# Patient Record
Sex: Female | Born: 1955 | Race: White | Hispanic: No | Marital: Married
Health system: Midwestern US, Academic
[De-identification: ages and names within clinical notes are randomized; demographics above are authoritative.]

---

## 2017-02-13 ENCOUNTER — Encounter: Admit: 2017-02-13 | Discharge: 2017-02-13 | Payer: BC Managed Care – PPO

## 2017-02-28 ENCOUNTER — Encounter: Admit: 2017-02-28 | Discharge: 2017-02-28 | Payer: BC Managed Care – PPO

## 2017-02-28 DIAGNOSIS — I10 Essential (primary) hypertension: Principal | ICD-10-CM

## 2017-02-28 DIAGNOSIS — E785 Hyperlipidemia, unspecified: ICD-10-CM

## 2017-02-28 DIAGNOSIS — E0590 Thyrotoxicosis, unspecified without thyrotoxic crisis or storm: ICD-10-CM

## 2017-02-28 DIAGNOSIS — I499 Cardiac arrhythmia, unspecified: ICD-10-CM

## 2017-02-28 DIAGNOSIS — R0600 Dyspnea, unspecified: ICD-10-CM

## 2017-02-28 DIAGNOSIS — F419 Anxiety disorder, unspecified: ICD-10-CM

## 2017-03-14 ENCOUNTER — Ambulatory Visit: Admit: 2017-03-14 | Discharge: 2017-03-15 | Payer: BC Managed Care – PPO

## 2017-03-14 ENCOUNTER — Encounter: Admit: 2017-03-14 | Discharge: 2017-03-14 | Payer: BC Managed Care – PPO

## 2017-03-14 DIAGNOSIS — R0600 Dyspnea, unspecified: ICD-10-CM

## 2017-03-14 DIAGNOSIS — E785 Hyperlipidemia, unspecified: ICD-10-CM

## 2017-03-14 DIAGNOSIS — E0590 Thyrotoxicosis, unspecified without thyrotoxic crisis or storm: ICD-10-CM

## 2017-03-14 DIAGNOSIS — I499 Cardiac arrhythmia, unspecified: ICD-10-CM

## 2017-03-14 DIAGNOSIS — I10 Essential (primary) hypertension: Principal | ICD-10-CM

## 2017-03-14 DIAGNOSIS — R002 Palpitations: Principal | ICD-10-CM

## 2017-03-14 DIAGNOSIS — F419 Anxiety disorder, unspecified: ICD-10-CM

## 2017-03-15 ENCOUNTER — Encounter: Admit: 2017-03-15 | Discharge: 2017-03-15 | Payer: BC Managed Care – PPO

## 2017-03-15 DIAGNOSIS — E785 Hyperlipidemia, unspecified: ICD-10-CM

## 2017-03-15 DIAGNOSIS — R002 Palpitations: Principal | ICD-10-CM

## 2017-03-15 DIAGNOSIS — I10 Essential (primary) hypertension: ICD-10-CM

## 2017-03-15 LAB — LIPID PROFILE
Lab: 120 — ABNORMAL HIGH (ref 35–60)
Lab: 51
Lab: 10
Lab: 138 — ABNORMAL HIGH (ref <100)
Lab: 2
Lab: 290 — ABNORMAL HIGH (ref 150–200)

## 2017-03-15 LAB — THYROID STIMULATING HORMONE-TSH: Lab: 3.4

## 2017-03-22 ENCOUNTER — Encounter: Admit: 2017-03-22 | Discharge: 2017-03-22 | Payer: BC Managed Care – PPO

## 2017-04-05 ENCOUNTER — Encounter: Admit: 2017-04-05 | Discharge: 2017-04-05 | Payer: BC Managed Care – PPO

## 2017-04-08 ENCOUNTER — Ambulatory Visit: Admit: 2017-04-08 | Discharge: 2017-04-08 | Payer: BC Managed Care – PPO

## 2017-04-08 ENCOUNTER — Encounter: Admit: 2017-04-08 | Discharge: 2017-04-08 | Payer: BC Managed Care – PPO

## 2017-04-08 DIAGNOSIS — I10 Essential (primary) hypertension: Secondary | ICD-10-CM

## 2017-04-08 DIAGNOSIS — E785 Hyperlipidemia, unspecified: ICD-10-CM

## 2017-04-08 DIAGNOSIS — R002 Palpitations: Principal | ICD-10-CM

## 2017-04-10 ENCOUNTER — Encounter: Admit: 2017-04-10 | Discharge: 2017-04-10 | Payer: BC Managed Care – PPO

## 2017-04-10 MED ORDER — ATORVASTATIN 40 MG PO TAB
40 mg | ORAL_TABLET | Freq: Every day | ORAL | 3 refills | Status: AC
Start: 2017-04-10 — End: 2018-03-25

## 2017-05-22 ENCOUNTER — Encounter: Admit: 2017-05-22 | Discharge: 2017-05-22 | Payer: BC Managed Care – PPO

## 2017-05-22 DIAGNOSIS — E782 Mixed hyperlipidemia: Principal | ICD-10-CM

## 2017-05-28 LAB — LIPID PROFILE: Lab: 165

## 2017-05-29 ENCOUNTER — Encounter: Admit: 2017-05-29 | Discharge: 2017-05-29 | Payer: BC Managed Care – PPO

## 2017-05-29 DIAGNOSIS — E782 Mixed hyperlipidemia: Principal | ICD-10-CM

## 2018-03-25 ENCOUNTER — Encounter: Admit: 2018-03-25 | Discharge: 2018-03-25 | Payer: BC Managed Care – PPO

## 2018-03-25 MED ORDER — ATORVASTATIN 40 MG PO TAB
40 mg | ORAL_TABLET | Freq: Every day | ORAL | 0 refills | Status: AC
Start: 2018-03-25 — End: 2018-06-17

## 2018-06-17 ENCOUNTER — Encounter: Admit: 2018-06-17 | Discharge: 2018-06-17 | Payer: BC Managed Care – PPO

## 2018-06-17 MED ORDER — ATORVASTATIN 40 MG PO TAB
ORAL_TABLET | Freq: Every day | 0 refills | Status: DC
Start: 2018-06-17 — End: 2018-09-08

## 2018-09-08 ENCOUNTER — Encounter: Admit: 2018-09-08 | Discharge: 2018-09-08

## 2018-09-08 DIAGNOSIS — R002 Palpitations: Secondary | ICD-10-CM

## 2018-09-08 DIAGNOSIS — I10 Essential (primary) hypertension: Secondary | ICD-10-CM

## 2018-09-08 DIAGNOSIS — E785 Hyperlipidemia, unspecified: Secondary | ICD-10-CM

## 2018-09-08 MED ORDER — ATORVASTATIN 40 MG PO TAB
ORAL_TABLET | Freq: Every day | 0 refills | Status: DC
Start: 2018-09-08 — End: 2018-09-30

## 2018-09-08 NOTE — Telephone Encounter
09/08/2018 9:51 AM     I called Rod Holler and got her scheduled with Dr Ricard Dillon in the [place] office also mailed lab orders to her home. Gaynelle Cage, RN

## 2018-09-16 ENCOUNTER — Encounter: Admit: 2018-09-16 | Discharge: 2018-09-16

## 2018-09-16 DIAGNOSIS — E785 Hyperlipidemia, unspecified: Secondary | ICD-10-CM

## 2018-09-16 DIAGNOSIS — R002 Palpitations: Secondary | ICD-10-CM

## 2018-09-16 DIAGNOSIS — I10 Essential (primary) hypertension: Secondary | ICD-10-CM

## 2018-09-16 LAB — COMPREHENSIVE METABOLIC PANEL: Lab: 139 FL (ref 7–11)

## 2018-09-16 LAB — LIPID PROFILE
Lab: 11 % — ABNORMAL HIGH (ref 0.57–1.11)
Lab: 175 % (ref 41–77)
Lab: 54 % (ref 24–44)
Lab: 73 % (ref 4–12)
Lab: 91 % (ref >60)

## 2018-09-30 ENCOUNTER — Encounter: Admit: 2018-09-30 | Discharge: 2018-09-30

## 2018-09-30 DIAGNOSIS — E785 Hyperlipidemia, unspecified: Secondary | ICD-10-CM

## 2018-09-30 MED ORDER — ATORVASTATIN 40 MG PO TAB
ORAL_TABLET | Freq: Every day | 3 refills | Status: AC
Start: 2018-09-30 — End: ?

## 2018-10-02 ENCOUNTER — Encounter: Admit: 2018-10-02 | Discharge: 2018-10-02

## 2018-10-02 ENCOUNTER — Ambulatory Visit: Admit: 2018-10-02 | Discharge: 2018-10-02

## 2018-10-02 DIAGNOSIS — F419 Anxiety disorder, unspecified: Secondary | ICD-10-CM

## 2018-10-02 DIAGNOSIS — I10 Essential (primary) hypertension: Secondary | ICD-10-CM

## 2018-10-02 DIAGNOSIS — R002 Palpitations: Secondary | ICD-10-CM

## 2018-10-02 DIAGNOSIS — E785 Hyperlipidemia, unspecified: Secondary | ICD-10-CM

## 2018-10-02 DIAGNOSIS — I499 Cardiac arrhythmia, unspecified: Secondary | ICD-10-CM

## 2018-10-02 DIAGNOSIS — R0600 Dyspnea, unspecified: Secondary | ICD-10-CM

## 2018-10-02 DIAGNOSIS — E0590 Thyrotoxicosis, unspecified without thyrotoxic crisis or storm: Secondary | ICD-10-CM

## 2018-10-02 NOTE — Assessment & Plan Note
She seems to be well managed on propranolol and I have not recommended any further testing or medication changes today.  I will plan to see her back in about a year but if the palpitations become more bothersome we will get something set up before then.

## 2018-10-02 NOTE — Assessment & Plan Note
Lab Results   Component Value Date    CHOL 175 09/16/2018    TRIG 54 09/16/2018    HDL 73 09/16/2018    LDL 91 09/16/2018    VLDL 11 09/16/2018    CHOLHDLC 2 09/16/2018      We do not know of any atherosclerotic vascular disease in her case so a LDL target of 100 or less seems reasonable.

## 2018-10-02 NOTE — Progress Notes
Date of Service: 10/02/2018    Jasmine Lynch is a 63 y.o. female.       HPI     Tenzin was in the [place] office today for follow-up. 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  She now does yoga 5 days/week and is helping a friend down-size her large house.    She's had a long history of palpitation symptoms for which she's taken propranolol.  I don't think we've ever really caught this on telemetry or on a monitor, but it certainly sounds like SVT.  She's continued to have occasional episodes, but they aren't very bothersome to her anymore and don't last long.  ???  She used to have a lot of trouble with allergies but when she retired this problem resolved, presumably because she was no longer working in the old, mold-infested building down at [employer] [place].    She enjoys her yoga exercise sessions and has had no problems with angina, breathlessness, or lightheadedness.  She denies any TIA or stroke symptoms.???         Vitals:    10/02/18 0919 10/02/18 0924   BP: 132/74 136/76   BP Source: Arm, Left Upper Arm, Right Upper   Pulse: 73    Temp: 36.8 ???C (98.3 ???F)    SpO2: 98%    Weight: 95.3 kg (210 lb)    Height: 1.778 m (5' 10)    PainSc: Zero      Body mass index is 30.13 kg/m???.     Past Medical History  Patient Active Problem List    Diagnosis Date Noted   ??? Hyperlipidemia 02/28/2017   ??? Hyperthyroidism 04/10/2011     1999 - Variability in thyroid levels.  2013 - Normal TSH     ??? Anxiety 04/10/2011   ??? Palpitations 04/10/2011   ??? HTN (hypertension) 03/26/2011     2009 - Medication started for hypertension.       ??? Multiple allergies 03/26/2011   ??? Acid reflux 03/26/2011         Review of Systems   Constitution: Negative.   HENT: Negative.    Eyes: Negative.    Cardiovascular: Negative.    Respiratory: Negative.    Endocrine: Negative.    Hematologic/Lymphatic: Negative.    Skin: Negative.    Musculoskeletal: Negative.    Gastrointestinal: Negative.    Genitourinary: Negative. Neurological: Negative.    Psychiatric/Behavioral: Negative.    Allergic/Immunologic: Negative.        Physical Exam    Physical Exam   General Appearance: no distress   Skin: warm, no ulcers or xanthomas   Digits and Nails: no cyanosis or clubbing   Eyes: conjunctivae and lids normal, pupils are equal and round   Teeth/Gums/Palate: dentition unremarkable, no lesions   Lips & Oral Mucosa: no pallor or cyanosis   Neck Veins: normal JVP , neck veins are not distended   Thyroid: no nodules, masses, tenderness or enlargement   Chest Inspection: chest is normal in appearance   Respiratory Effort: breathing comfortably, no respiratory distress   Auscultation/Percussion: lungs clear to auscultation, no rales or rhonchi, no wheezing   PMI: PMI not enlarged or displaced   Cardiac Rhythm: regular rhythm and normal rate   Cardiac Auscultation: S1, S2 normal, no rub, no gallop   Murmurs: no murmur   Peripheral Circulation: normal peripheral circulation   Carotid Arteries: normal carotid upstroke bilaterally, no bruits   Radial Arteries: normal  symmetric radial pulses   Abdominal Aorta: no abdominal aortic bruit   Pedal Pulses: normal symmetric pedal pulses   Lower Extremity Edema: no lower extremity edema   Abdominal Exam: soft, non-tender, no masses, bowel sounds normal   Liver & Spleen: no organomegaly   Gait & Station: walks without assistance   Muscle Strength: normal muscle tone   Orientation: oriented to time, place and person   Affect & Mood: appropriate and sustained affect   Language and Memory: patient responsive and seems to comprehend information   Neurologic Exam: neurological assessment grossly intact   Other: moves all extremities      Problems Addressed Today  Encounter Diagnoses   Name Primary?   ??? Essential hypertension    ??? Hyperlipidemia, unspecified hyperlipidemia type    ??? Palpitations        Assessment and Plan       HTN (hypertension)  She occasionally checks home blood pressure and it tends to run about 130/80.    Hyperlipidemia  Lab Results   Component Value Date    CHOL 175 09/16/2018    TRIG 54 09/16/2018    HDL 73 09/16/2018    LDL 91 09/16/2018    VLDL 11 09/16/2018    CHOLHDLC 2 09/16/2018      We do not know of any atherosclerotic vascular disease in her case so a LDL target of 100 or less seems reasonable.    Palpitations  She seems to be well managed on propranolol and I have not recommended any further testing or medication changes today.  I will plan to see her back in about a year but if the palpitations become more bothersome we will get something set up before then.      Current Medications (including today's revisions)  ??? ALPRAZolam (XANAX) 0.25 mg tablet Take 0.25 mg by mouth at bedtime as needed.     ??? aspirin 81 mg chewable tablet Take 81 mg by mouth daily.     ??? atorvastatin (LIPITOR) 40 mg tablet TAKE 1 TABLET BY MOUTH EVERY DAY   ??? azelastine-fluticasone (DYMISTA) 137-50 mcg/spray nasal spray Apply 1 spray to each nostril as directed daily.   ??? Calcium Carbonate-Vitamin D2 (OYSTER SHELL CALCIUM-VIT D2) 500 mg(1,250mg ) -200 unit tab Take 2 tablets by mouth daily.   ??? cetirizine (ZYRTEC) 10 mg tablet Take 10 mg by mouth every morning.   ??? erenumab-aooe (AIMOVIG) 140 mg/mL injection Inject 140 mg under the skin every 28 days.   ??? FLAXSEED OIL PO Take 1,200 mg by mouth daily.   ??? glucosamine HCl/chondroitin su (GLUCOSAMINE-CHONDROITIN PO) Take 2 tablets by mouth daily.   ??? Lactobacillus Acidophilus cap Take 1 Cap by mouth daily.     ??? Magnesium Glycinate 100 mg tab Take 200 mg by mouth four times daily.   ??? meloxicam (MOBIC) 7.5 mg tablet Take 7.5 mg by mouth as Needed for Pain.   ??? MULTIVITAMIN W-MINERALS/LUTEIN (CENTRUM SILVER PO) Take 1 Tab by mouth daily.     ??? pantoprazole DR (PROTONIX) 40 mg tablet Take 40 mg by mouth daily.   ??? PROPRANOLOL HCL (INDERAL LA PO) Take 60 mg by mouth twice daily.   ??? triamcinolone (NASACORT) 55 mcg nasal inhaler Apply 2 sprays to each nostril as directed daily.

## 2018-10-02 NOTE — Assessment & Plan Note
She occasionally checks home blood pressure and it tends to run about 130/80.

## 2019-10-19 IMAGING — MG MAMMOGRAM 3D SCREEN, BILATERAL
13 of 16 series · 13 of 16 positions shown · non-contrast
Comparison: none

[R CC (1 of 2)]
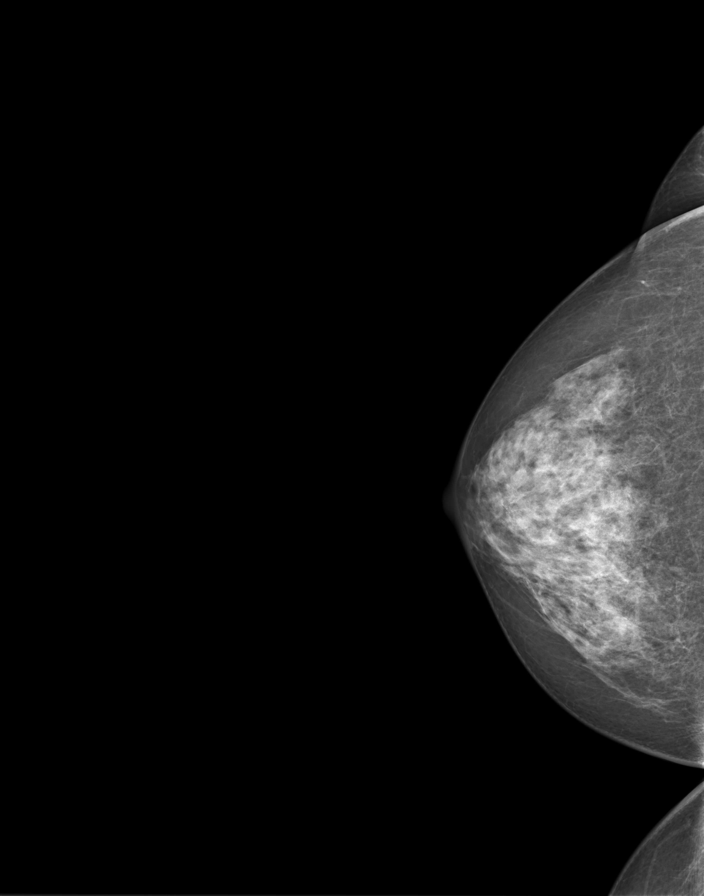

[R tomo (1 of 2)]
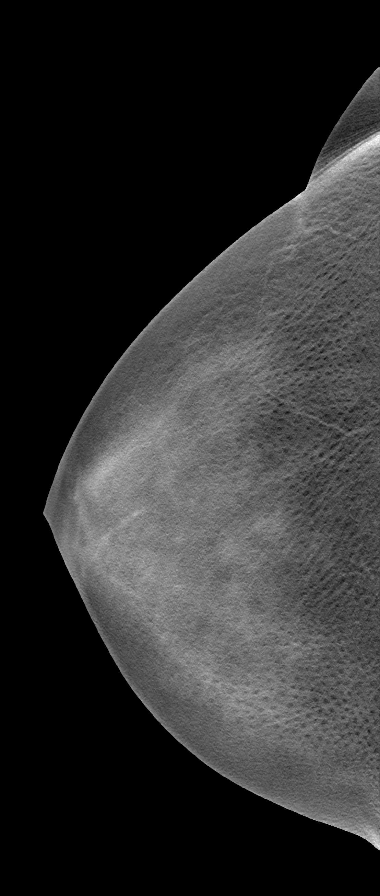

[R CC (2 of 2)]
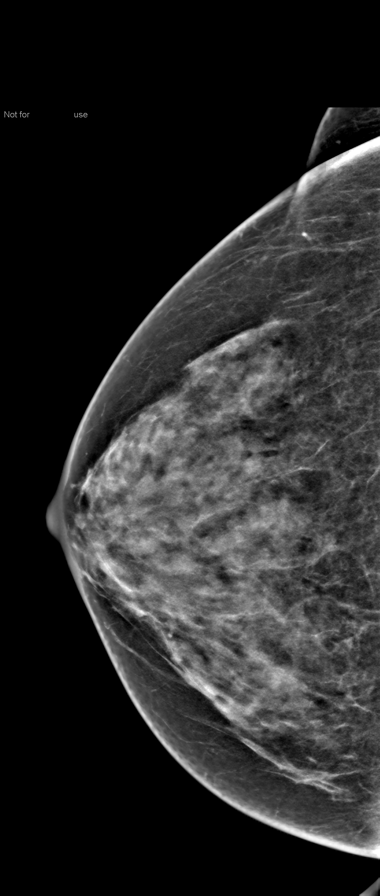

[R (1 of 2)]
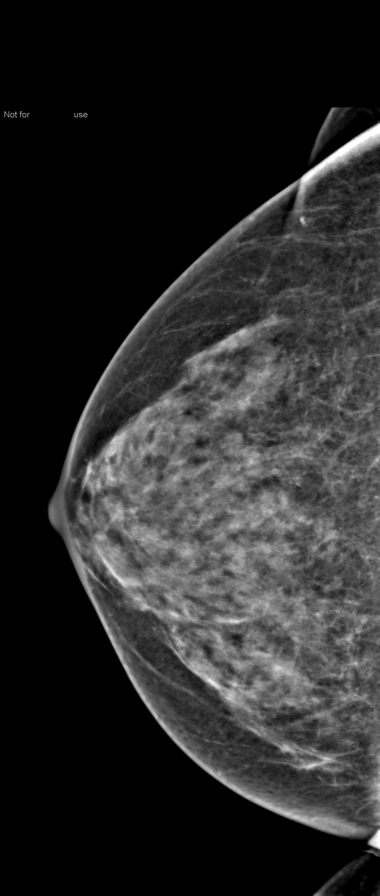

[L CC (1 of 2)]
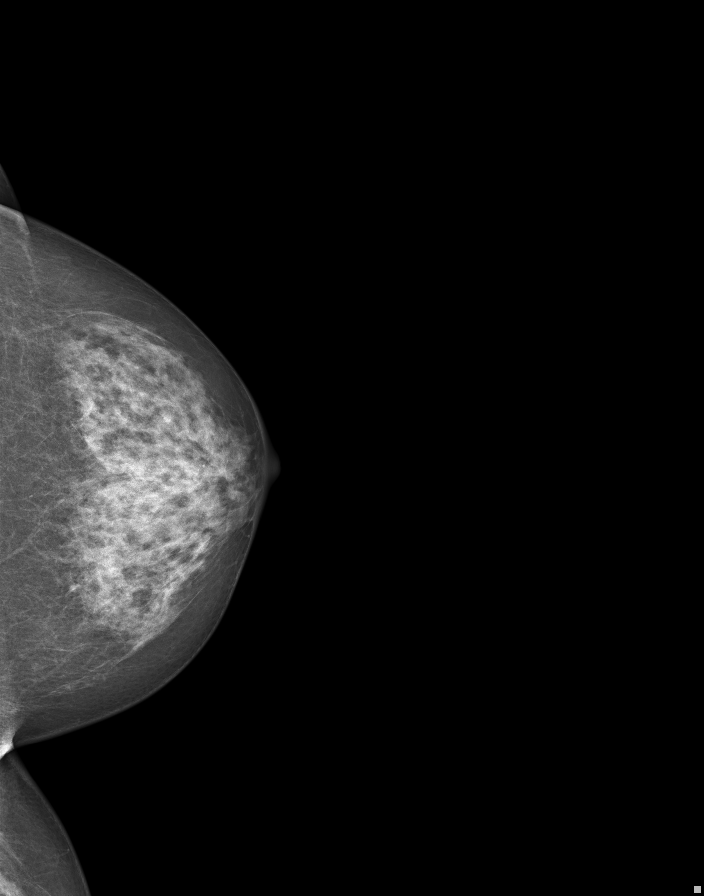

[L tomo]
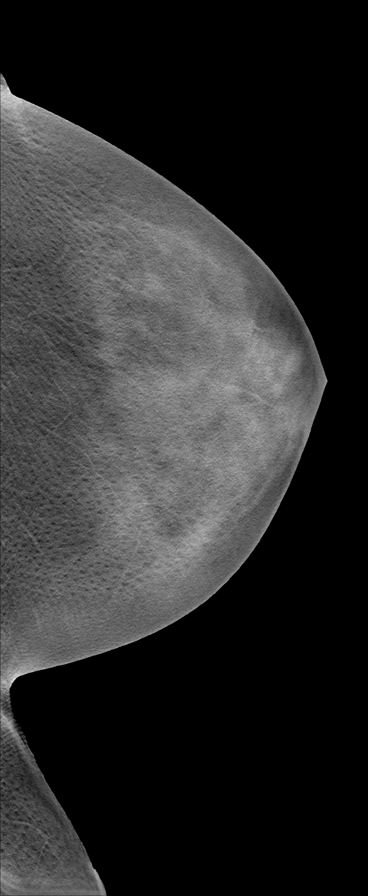

[L CC (2 of 2)]
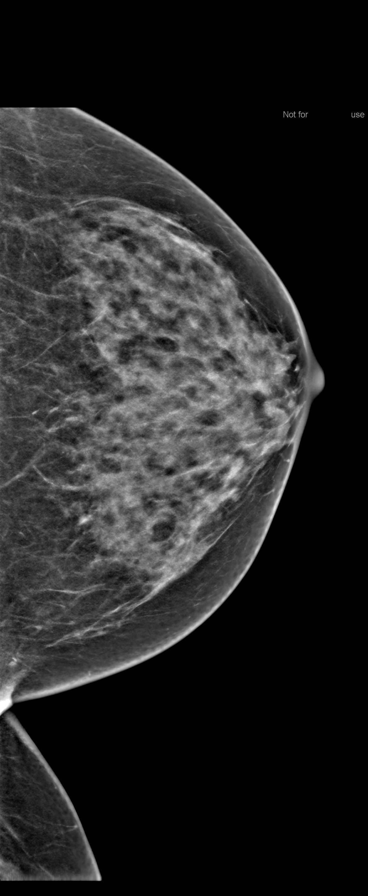

[L]
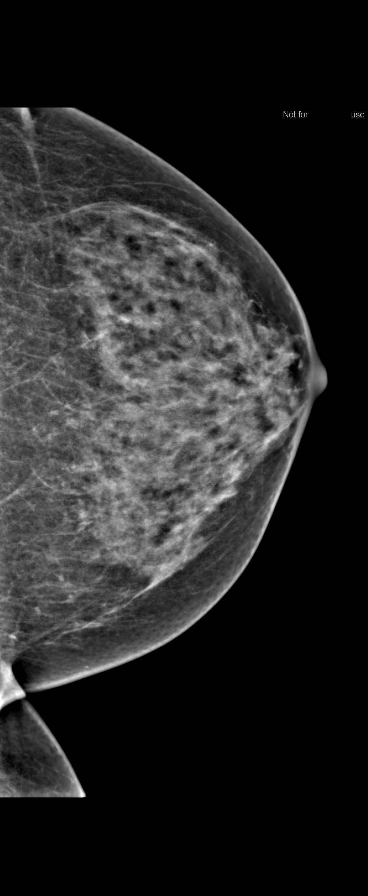

[R MLO (1 of 2)]
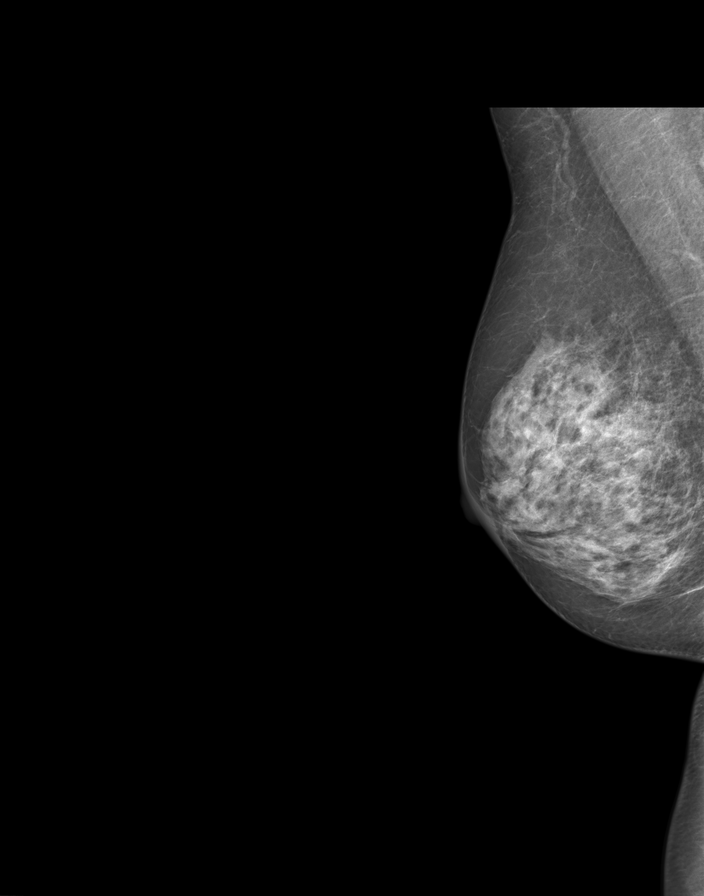

[R tomo (2 of 2)]
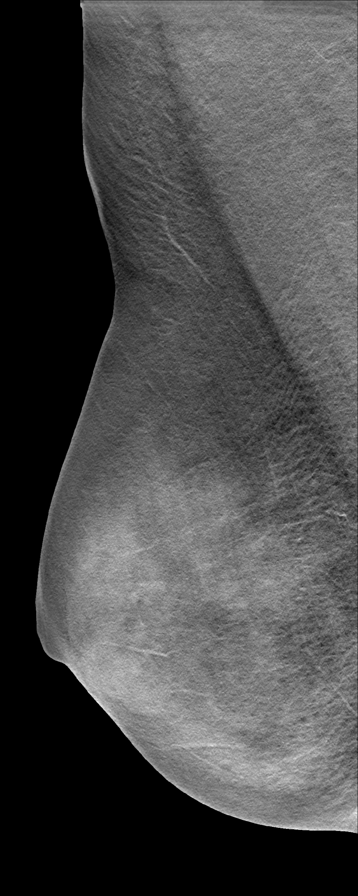

[R MLO (2 of 2)]
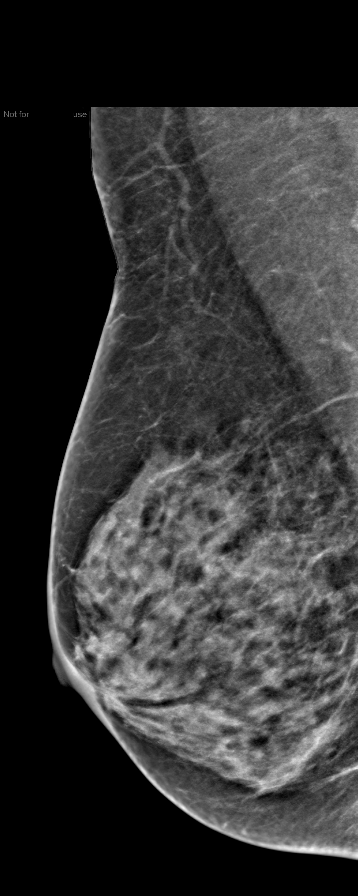

[R (2 of 2)]
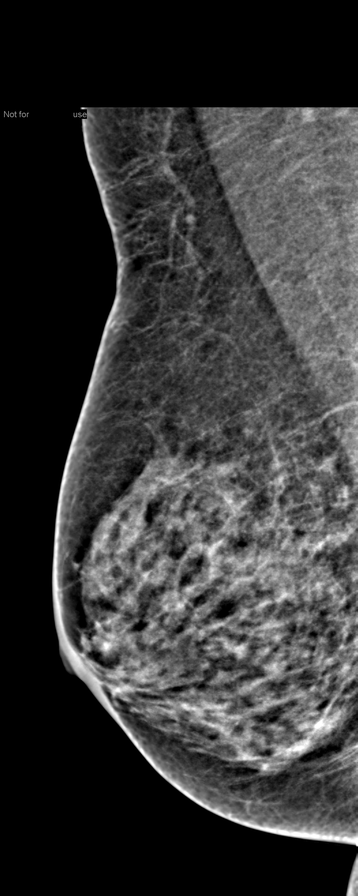

[L MLO]
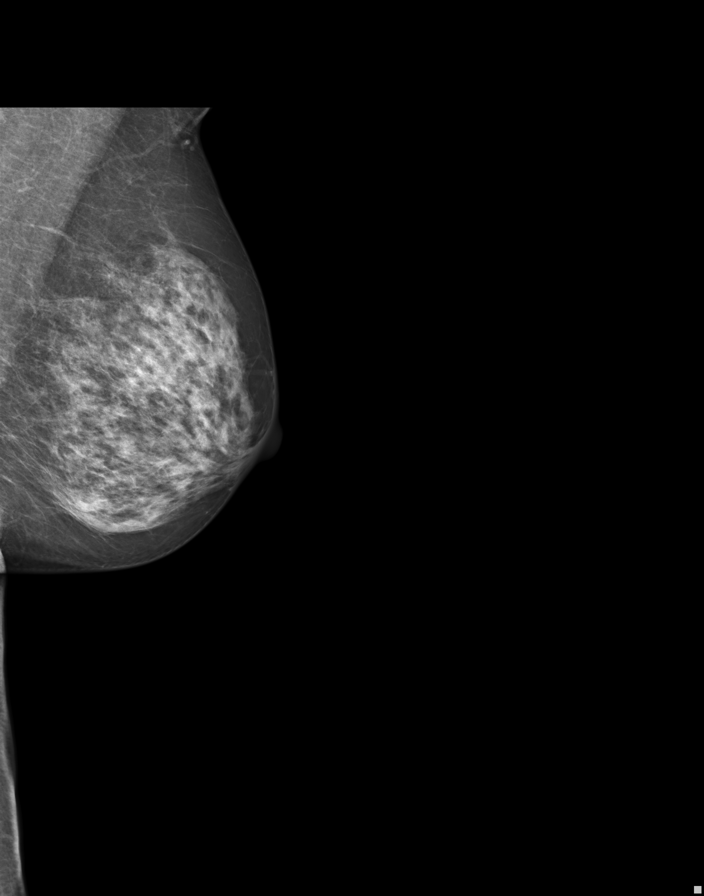

[13 of 16 positions shown; findings below may reference images not displayed]

DIAGNOSTIC STUDIES

EXAM

BILATERAL DIGITAL SCREENING MAMMOGRAM, [id number]; WITH CAD

INDICATION

Screening.

TECHNIQUE

Digital 2D CC and MLO projections obtained with 3D tomographic views per manufacturer's protocol.
ICAD version 7.2 was used during this exam.

COMPARISONS

FINDINGS

The Breast tissue is heterogeneously dense. No new dominant mass or suspicious calcification is
identified.

The false negative rate of mammography is approximately 10%. Management of a palpable abnormality
must be based on clinical exam.

IMPRESSION

BI-RADS 1; Negative.

A twelve month screening mammogram is recommended and a follow-up letter will be scheduled.

Tech Notes:

## 2019-11-05 ENCOUNTER — Encounter: Admit: 2019-11-05 | Discharge: 2019-11-05 | Payer: BC Managed Care – PPO

## 2019-11-05 DIAGNOSIS — E785 Hyperlipidemia, unspecified: Secondary | ICD-10-CM

## 2019-11-05 MED ORDER — ATORVASTATIN 40 MG PO TAB
ORAL_TABLET | Freq: Every day | 3 refills | Status: AC
Start: 2019-11-05 — End: ?

## 2019-12-03 ENCOUNTER — Encounter: Admit: 2019-12-03 | Discharge: 2019-12-03 | Payer: BC Managed Care – PPO

## 2019-12-03 DIAGNOSIS — E785 Hyperlipidemia, unspecified: Secondary | ICD-10-CM

## 2019-12-03 DIAGNOSIS — F419 Anxiety disorder, unspecified: Secondary | ICD-10-CM

## 2019-12-03 DIAGNOSIS — R0600 Dyspnea, unspecified: Secondary | ICD-10-CM

## 2019-12-03 DIAGNOSIS — E0590 Thyrotoxicosis, unspecified without thyrotoxic crisis or storm: Secondary | ICD-10-CM

## 2019-12-03 DIAGNOSIS — I10 Essential (primary) hypertension: Secondary | ICD-10-CM

## 2019-12-03 DIAGNOSIS — I499 Cardiac arrhythmia, unspecified: Secondary | ICD-10-CM

## 2019-12-03 MED ORDER — PROPRANOLOL 60 MG PO CS24
60 mg | ORAL_CAPSULE | Freq: Two times a day (BID) | ORAL | 3 refills | Status: AC
Start: 2019-12-03 — End: ?

## 2019-12-04 ENCOUNTER — Encounter: Admit: 2019-12-04 | Discharge: 2019-12-04 | Payer: BC Managed Care – PPO

## 2019-12-23 ENCOUNTER — Encounter: Admit: 2019-12-23 | Discharge: 2019-12-23 | Payer: BC Managed Care – PPO

## 2019-12-23 MED ORDER — ROSUVASTATIN 20 MG PO TAB
20 mg | ORAL_TABLET | ORAL | 0 refills | 90.00000 days | Status: AC
Start: 2019-12-23 — End: ?

## 2019-12-23 NOTE — Telephone Encounter
Vanice Sarah, MD  Florene Route, RN  So she could start rosuvastatin 20 mg twice weekly at first. Thanks!       Recommendations called to patient. Patient has no questions at this time. Prescription sent to pharmacy.

## 2019-12-23 NOTE — Telephone Encounter
-----   Message from Connye Burkitt, RN sent at 12/23/2019 10:16 AM CST -----  Regarding: FW: Follow up on stopping atorvastatin    ----- Message -----  From: Connye Burkitt, RN  Sent: 12/23/2019  10:04 AM CST  To: Connye Burkitt, RN  Subject: FW: Follow up on stopping atorvastatin             ----- Message -----  From: Janett Labella, RN  Sent: 12/23/2019   9:56 AM CST  To: [facility] Nurse Gen Card Team [place]  Subject: FW: Follow up on stopping atorvastatin             ----- Message -----  From: Devra Dopp  Sent: 12/23/2019   9:49 AM CST  To: [facility] Nurse Triage [place]  Subject: Follow up on stopping atorvastatin               I did stop taking atorvastatin 10/28 as suggested & I am sleeping better. Dr mentioned prescribing another statin instead?

## 2020-08-28 ENCOUNTER — Encounter: Admit: 2020-08-28 | Discharge: 2020-08-28 | Payer: BC Managed Care – PPO

## 2020-08-28 MED ORDER — ROSUVASTATIN 20 MG PO TAB
20 mg | ORAL_TABLET | ORAL | 3 refills
Start: 2020-08-28 — End: ?

## 2020-10-26 ENCOUNTER — Encounter: Admit: 2020-10-26 | Discharge: 2020-10-26 | Payer: BC Managed Care – PPO

## 2020-11-04 ENCOUNTER — Ambulatory Visit: Admit: 2020-11-04 | Discharge: 2020-11-04 | Payer: MEDICARE

## 2020-11-04 ENCOUNTER — Encounter: Admit: 2020-11-04 | Discharge: 2020-11-04 | Payer: MEDICARE

## 2020-11-04 DIAGNOSIS — I480 Paroxysmal atrial fibrillation: Secondary | ICD-10-CM

## 2020-11-04 DIAGNOSIS — F419 Anxiety disorder, unspecified: Secondary | ICD-10-CM

## 2020-11-04 DIAGNOSIS — E785 Hyperlipidemia, unspecified: Secondary | ICD-10-CM

## 2020-11-04 DIAGNOSIS — E0590 Thyrotoxicosis, unspecified without thyrotoxic crisis or storm: Secondary | ICD-10-CM

## 2020-11-04 DIAGNOSIS — I499 Cardiac arrhythmia, unspecified: Secondary | ICD-10-CM

## 2020-11-04 DIAGNOSIS — I10 Essential (primary) hypertension: Secondary | ICD-10-CM

## 2020-11-04 DIAGNOSIS — R0600 Dyspnea, unspecified: Secondary | ICD-10-CM

## 2020-11-04 LAB — TSH WITH FREE T4 REFLEX: TSH: 2.8 uU/mL (ref 0.35–5.00)

## 2020-11-04 MED ORDER — LEVOTHYROXINE 25 MCG PO TAB
25 ug | ORAL_TABLET | Freq: Every day | ORAL | 3 refills | 30.00000 days | Status: AC
Start: 2020-11-04 — End: ?

## 2020-11-04 NOTE — Telephone Encounter
Sent order to pharmacy.    I sent Mychart message to the patient discussing the results and Dr. Barry Dienes' recommendations.    Message  Received: Today  Vanice Sarah, MD  Nicole Cella, MD  Cc: Connye Burkitt, RN  Caleigha Zale, can you please call in a 25 mcg (0.025 mg) Synthroid Rx for her? Thanks.

## 2020-12-04 ENCOUNTER — Encounter: Admit: 2020-12-04 | Discharge: 2020-12-04 | Payer: MEDICARE

## 2020-12-04 MED ORDER — PROPRANOLOL 60 MG PO CS24
ORAL_CAPSULE | Freq: Two times a day (BID) | 3 refills
Start: 2020-12-04 — End: ?

## 2021-05-08 ENCOUNTER — Encounter: Admit: 2021-05-08 | Discharge: 2021-05-08 | Payer: MEDICARE

## 2021-05-10 ENCOUNTER — Encounter: Admit: 2021-05-10 | Discharge: 2021-05-10 | Payer: MEDICARE

## 2021-05-10 ENCOUNTER — Ambulatory Visit: Admit: 2021-05-10 | Discharge: 2021-05-11 | Payer: MEDICARE

## 2021-05-10 DIAGNOSIS — E039 Hypothyroidism, unspecified: Secondary | ICD-10-CM

## 2021-05-10 DIAGNOSIS — R519 Chronic idiopathic facial pain: Secondary | ICD-10-CM

## 2021-05-10 DIAGNOSIS — H269 Unspecified cataract: Secondary | ICD-10-CM

## 2021-05-10 DIAGNOSIS — I10 Essential (primary) hypertension: Secondary | ICD-10-CM

## 2021-05-10 DIAGNOSIS — D72819 Decreased white blood cell count, unspecified: Secondary | ICD-10-CM

## 2021-05-10 DIAGNOSIS — E782 Mixed hyperlipidemia: Secondary | ICD-10-CM

## 2021-05-10 DIAGNOSIS — E559 Vitamin D deficiency, unspecified: Secondary | ICD-10-CM

## 2021-05-10 DIAGNOSIS — K219 Gastro-esophageal reflux disease without esophagitis: Secondary | ICD-10-CM

## 2021-05-10 DIAGNOSIS — E0590 Thyrotoxicosis, unspecified without thyrotoxic crisis or storm: Secondary | ICD-10-CM

## 2021-05-10 DIAGNOSIS — R430 Anosmia: Secondary | ICD-10-CM

## 2021-05-10 DIAGNOSIS — H547 Unspecified visual loss: Secondary | ICD-10-CM

## 2021-05-10 DIAGNOSIS — J301 Allergic rhinitis due to pollen: Secondary | ICD-10-CM

## 2021-05-10 DIAGNOSIS — I499 Cardiac arrhythmia, unspecified: Secondary | ICD-10-CM

## 2021-05-10 DIAGNOSIS — M159 Polyosteoarthritis, unspecified: Secondary | ICD-10-CM

## 2021-05-10 DIAGNOSIS — E785 Hyperlipidemia, unspecified: Secondary | ICD-10-CM

## 2021-05-10 DIAGNOSIS — F419 Anxiety disorder, unspecified: Secondary | ICD-10-CM

## 2021-05-10 DIAGNOSIS — R0600 Dyspnea, unspecified: Secondary | ICD-10-CM

## 2021-05-10 DIAGNOSIS — M549 Dorsalgia, unspecified: Secondary | ICD-10-CM

## 2021-05-10 DIAGNOSIS — T7840XA Allergy, unspecified, initial encounter: Secondary | ICD-10-CM

## 2021-05-10 LAB — VITAMIN B12: VITAMIN B12: 917 pg/mL — ABNORMAL HIGH (ref 180–914)

## 2021-05-10 LAB — LIPID PROFILE: CHOLESTEROL: 218 mg/dL — ABNORMAL HIGH (ref <200)

## 2021-05-10 LAB — CBC AND DIFF
ABSOLUTE BASO COUNT: 0 K/UL (ref 0–0.20)
ABSOLUTE EOS COUNT: 0.1 K/UL (ref 0–0.45)
WBC COUNT: 5.9 K/UL (ref <150)

## 2021-05-10 LAB — COMPREHENSIVE METABOLIC PANEL
ALBUMIN: 4.3 g/dL (ref 3.5–5.0)
ALK PHOSPHATASE: 41 U/L (ref 25–110)
ALT: 20 U/L (ref 7–56)
ANION GAP: 9 K/UL (ref 3–12)
AST: 19 U/L (ref 7–40)
BLD UREA NITROGEN: 19 mg/dL (ref 7–25)
CALCIUM: 9.9 mg/dL (ref 8.5–10.6)
CHLORIDE: 101 MMOL/L (ref 98–110)
CO2: 28 MMOL/L (ref 21–30)
CREATININE: 1 mg/dL — ABNORMAL HIGH (ref 0.4–1.00)
EGFR: 57 mL/min — ABNORMAL LOW (ref >60)
GLUCOSE,PANEL: 94 mg/dL (ref 70–100)
POTASSIUM: 4.3 MMOL/L (ref 3.5–5.1)
SODIUM: 138 MMOL/L — ABNORMAL HIGH (ref <100)
TOTAL BILIRUBIN: 0.4 mg/dL (ref 0.3–1.2)
TOTAL PROTEIN: 7 g/dL (ref 6.0–8.0)

## 2021-05-10 LAB — TSH WITH FREE T4 REFLEX: TSH: 1.9 uU/mL (ref >40)

## 2021-05-10 LAB — SED RATE: ESR: 53 mm/h — ABNORMAL HIGH (ref 0–30)

## 2021-05-11 DIAGNOSIS — G43009 Migraine without aura, not intractable, without status migrainosus: Secondary | ICD-10-CM

## 2021-05-11 DIAGNOSIS — F419 Anxiety disorder, unspecified: Secondary | ICD-10-CM

## 2021-05-15 ENCOUNTER — Encounter: Admit: 2021-05-15 | Discharge: 2021-05-15 | Payer: MEDICARE

## 2021-05-15 DIAGNOSIS — M549 Dorsalgia, unspecified: Secondary | ICD-10-CM

## 2021-05-15 DIAGNOSIS — D72819 Decreased white blood cell count, unspecified: Secondary | ICD-10-CM

## 2021-05-15 DIAGNOSIS — E039 Hypothyroidism, unspecified: Secondary | ICD-10-CM

## 2021-05-15 DIAGNOSIS — G43109 Migraine with aura, not intractable, without status migrainosus: Secondary | ICD-10-CM

## 2021-05-15 DIAGNOSIS — E0590 Thyrotoxicosis, unspecified without thyrotoxic crisis or storm: Secondary | ICD-10-CM

## 2021-05-15 DIAGNOSIS — I499 Cardiac arrhythmia, unspecified: Secondary | ICD-10-CM

## 2021-05-15 DIAGNOSIS — J301 Allergic rhinitis due to pollen: Secondary | ICD-10-CM

## 2021-05-15 DIAGNOSIS — F419 Anxiety disorder, unspecified: Secondary | ICD-10-CM

## 2021-05-15 DIAGNOSIS — R519 Chronic idiopathic facial pain: Secondary | ICD-10-CM

## 2021-05-15 DIAGNOSIS — R430 Anosmia: Secondary | ICD-10-CM

## 2021-05-15 DIAGNOSIS — H269 Unspecified cataract: Secondary | ICD-10-CM

## 2021-05-15 DIAGNOSIS — K219 Gastro-esophageal reflux disease without esophagitis: Secondary | ICD-10-CM

## 2021-05-15 DIAGNOSIS — T7840XA Allergy, unspecified, initial encounter: Secondary | ICD-10-CM

## 2021-05-15 DIAGNOSIS — R0600 Dyspnea, unspecified: Secondary | ICD-10-CM

## 2021-05-15 DIAGNOSIS — H547 Unspecified visual loss: Secondary | ICD-10-CM

## 2021-05-15 DIAGNOSIS — I10 Essential (primary) hypertension: Secondary | ICD-10-CM

## 2021-05-15 DIAGNOSIS — E785 Hyperlipidemia, unspecified: Secondary | ICD-10-CM

## 2021-05-22 ENCOUNTER — Encounter: Admit: 2021-05-22 | Discharge: 2021-05-22 | Payer: MEDICARE

## 2021-05-23 ENCOUNTER — Ambulatory Visit: Admit: 2021-05-23 | Discharge: 2021-05-23 | Payer: MEDICARE

## 2021-05-23 ENCOUNTER — Encounter: Admit: 2021-05-23 | Discharge: 2021-05-23 | Payer: MEDICARE

## 2021-05-23 DIAGNOSIS — R700 Elevated erythrocyte sedimentation rate: Secondary | ICD-10-CM

## 2021-05-23 LAB — SED RATE: ESR: 35 mm/h — ABNORMAL HIGH (ref 0–30)

## 2021-05-23 LAB — RHEUMATOID FACTOR (RF): RF SCREEN: <10 [IU]/mL (ref <25)

## 2021-06-12 ENCOUNTER — Encounter: Admit: 2021-06-12 | Discharge: 2021-06-12 | Payer: MEDICARE

## 2021-07-14 ENCOUNTER — Encounter: Admit: 2021-07-14 | Discharge: 2021-07-14 | Payer: MEDICARE

## 2021-07-14 MED ORDER — ROSUVASTATIN 20 MG PO TAB
20 mg | ORAL_TABLET | ORAL | 3 refills
Start: 2021-07-14 — End: ?

## 2021-08-26 ENCOUNTER — Encounter: Admit: 2021-08-26 | Discharge: 2021-08-26 | Payer: MEDICARE

## 2021-08-26 MED ORDER — LEVOTHYROXINE 25 MCG PO TAB
ORAL_TABLET | 3 refills
Start: 2021-08-26 — End: ?

## 2021-09-20 ENCOUNTER — Encounter: Admit: 2021-09-20 | Discharge: 2021-09-20 | Payer: MEDICARE

## 2021-09-20 ENCOUNTER — Ambulatory Visit: Admit: 2021-09-20 | Discharge: 2021-09-21 | Payer: MEDICARE

## 2021-09-20 DIAGNOSIS — G43109 Migraine with aura, not intractable, without status migrainosus: Secondary | ICD-10-CM

## 2021-09-20 DIAGNOSIS — I10 Essential (primary) hypertension: Secondary | ICD-10-CM

## 2021-09-20 DIAGNOSIS — J301 Allergic rhinitis due to pollen: Secondary | ICD-10-CM

## 2021-09-20 DIAGNOSIS — Z1211 Encounter for screening for malignant neoplasm of colon: Secondary | ICD-10-CM

## 2021-09-20 DIAGNOSIS — M159 Polyosteoarthritis, unspecified: Secondary | ICD-10-CM

## 2021-09-20 DIAGNOSIS — R519 Chronic idiopathic facial pain: Secondary | ICD-10-CM

## 2021-09-20 DIAGNOSIS — E782 Mixed hyperlipidemia: Secondary | ICD-10-CM

## 2021-09-20 DIAGNOSIS — H269 Unspecified cataract: Secondary | ICD-10-CM

## 2021-09-20 DIAGNOSIS — R430 Anosmia: Secondary | ICD-10-CM

## 2021-09-20 DIAGNOSIS — E559 Vitamin D deficiency, unspecified: Secondary | ICD-10-CM

## 2021-09-20 DIAGNOSIS — E785 Hyperlipidemia, unspecified: Secondary | ICD-10-CM

## 2021-09-20 DIAGNOSIS — K219 Gastro-esophageal reflux disease without esophagitis: Secondary | ICD-10-CM

## 2021-09-20 DIAGNOSIS — F419 Anxiety disorder, unspecified: Secondary | ICD-10-CM

## 2021-09-20 DIAGNOSIS — J329 Chronic sinusitis, unspecified: Secondary | ICD-10-CM

## 2021-09-20 DIAGNOSIS — T7840XA Allergy, unspecified, initial encounter: Secondary | ICD-10-CM

## 2021-09-20 DIAGNOSIS — H547 Unspecified visual loss: Secondary | ICD-10-CM

## 2021-09-20 DIAGNOSIS — D72819 Decreased white blood cell count, unspecified: Secondary | ICD-10-CM

## 2021-09-20 DIAGNOSIS — E0590 Thyrotoxicosis, unspecified without thyrotoxic crisis or storm: Secondary | ICD-10-CM

## 2021-09-20 DIAGNOSIS — I480 Paroxysmal atrial fibrillation: Secondary | ICD-10-CM

## 2021-09-20 DIAGNOSIS — R0600 Dyspnea, unspecified: Secondary | ICD-10-CM

## 2021-09-20 DIAGNOSIS — E039 Hypothyroidism, unspecified: Secondary | ICD-10-CM

## 2021-09-20 DIAGNOSIS — I499 Cardiac arrhythmia, unspecified: Secondary | ICD-10-CM

## 2021-09-20 DIAGNOSIS — M549 Dorsalgia, unspecified: Secondary | ICD-10-CM

## 2021-09-20 MED ORDER — ALPRAZOLAM 0.25 MG PO TAB
.25 mg | ORAL_TABLET | Freq: Every evening | ORAL | 0 refills | Status: AC | PRN
Start: 2021-09-20 — End: ?

## 2021-09-20 NOTE — Patient Instructions
Please get labs drawn before your next visit. You should be fasting for 8-12 hours but you can and should drink water or black coffee and take your morning medications except diabetic medications. Wait until you eat to take diabetic medications.

## 2021-09-21 DIAGNOSIS — Z1159 Encounter for screening for other viral diseases: Secondary | ICD-10-CM

## 2021-09-21 DIAGNOSIS — Z889 Allergy status to unspecified drugs, medicaments and biological substances status: Secondary | ICD-10-CM

## 2021-09-28 ENCOUNTER — Encounter: Admit: 2021-09-28 | Discharge: 2021-09-28 | Payer: MEDICARE

## 2021-09-28 ENCOUNTER — Ambulatory Visit: Admit: 2021-09-28 | Discharge: 2021-09-28 | Payer: MEDICARE

## 2021-10-11 ENCOUNTER — Ambulatory Visit: Admit: 2021-10-11 | Discharge: 2021-10-11 | Payer: MEDICARE

## 2021-10-11 ENCOUNTER — Encounter: Admit: 2021-10-11 | Discharge: 2021-10-11 | Payer: MEDICARE

## 2021-10-11 DIAGNOSIS — G43109 Migraine with aura, not intractable, without status migrainosus: Secondary | ICD-10-CM

## 2021-10-11 DIAGNOSIS — I10 Essential (primary) hypertension: Secondary | ICD-10-CM

## 2021-10-11 DIAGNOSIS — Z1159 Encounter for screening for other viral diseases: Secondary | ICD-10-CM

## 2021-10-11 LAB — LIPID PROFILE
CHOLESTEROL: 187 mg/dL (ref <200)
LDL: 89 mg/dL — ABNORMAL HIGH (ref <100)
NON HDL CHOLESTEROL: 108 mg/dL (ref 6.0–8.0)
TRIGLYCERIDES: 56 mg/dL (ref <150)

## 2021-10-11 LAB — VITAMIN B12: VITAMIN B12: 866 pg/mL (ref 180–914)

## 2021-10-11 LAB — MAGNESIUM: MAGNESIUM: 2.2 mg/dL (ref 1.6–2.6)

## 2021-10-11 LAB — CBC
RBC COUNT: 4.3 M/UL (ref 4.0–5.0)
WBC COUNT: 4.2 K/UL — ABNORMAL LOW (ref 4.5–11.0)

## 2021-10-11 LAB — COMPREHENSIVE METABOLIC PANEL
ALBUMIN: 4 g/dL (ref 3.5–5.0)
ALK PHOSPHATASE: 55 U/L (ref 25–110)
ALT: 20 U/L (ref 7–56)
AST: 23 U/L (ref 7–40)
CO2: 26 MMOL/L (ref 21–30)
POTASSIUM: 4.2 MMOL/L (ref 3.5–5.1)
SODIUM: 140 MMOL/L (ref 137–147)

## 2021-10-11 LAB — HEPATITIS C ANTIBODY W REFLEX HCV PCR QUANT

## 2021-10-11 LAB — TSH WITH FREE T4 REFLEX: TSH: 2.1 uU/mL — ABNORMAL HIGH (ref >40)

## 2021-10-13 ENCOUNTER — Encounter: Admit: 2021-10-13 | Discharge: 2021-10-13 | Payer: MEDICARE

## 2021-10-13 DIAGNOSIS — Z1231 Encounter for screening mammogram for malignant neoplasm of breast: Secondary | ICD-10-CM

## 2021-10-17 ENCOUNTER — Encounter: Admit: 2021-10-17 | Discharge: 2021-10-17 | Payer: MEDICARE | Primary: Geriatric Medicine

## 2021-10-25 ENCOUNTER — Encounter: Admit: 2021-10-25 | Discharge: 2021-10-25 | Payer: MEDICARE | Primary: Geriatric Medicine

## 2021-11-10 ENCOUNTER — Encounter: Admit: 2021-11-10 | Discharge: 2021-11-10 | Payer: MEDICARE | Primary: Geriatric Medicine

## 2021-11-10 ENCOUNTER — Ambulatory Visit: Admit: 2021-11-10 | Discharge: 2021-11-10 | Payer: MEDICARE | Primary: Geriatric Medicine

## 2021-11-10 DIAGNOSIS — D72819 Decreased white blood cell count, unspecified: Secondary | ICD-10-CM

## 2021-11-10 DIAGNOSIS — G43109 Migraine with aura, not intractable, without status migrainosus: Secondary | ICD-10-CM

## 2021-11-10 DIAGNOSIS — E0590 Thyrotoxicosis, unspecified without thyrotoxic crisis or storm: Secondary | ICD-10-CM

## 2021-11-10 DIAGNOSIS — E785 Hyperlipidemia, unspecified: Secondary | ICD-10-CM

## 2021-11-10 DIAGNOSIS — J301 Allergic rhinitis due to pollen: Secondary | ICD-10-CM

## 2021-11-10 DIAGNOSIS — R430 Anosmia: Secondary | ICD-10-CM

## 2021-11-10 DIAGNOSIS — I10 Essential (primary) hypertension: Secondary | ICD-10-CM

## 2021-11-10 DIAGNOSIS — M549 Dorsalgia, unspecified: Secondary | ICD-10-CM

## 2021-11-10 DIAGNOSIS — H547 Unspecified visual loss: Secondary | ICD-10-CM

## 2021-11-10 DIAGNOSIS — R0600 Dyspnea, unspecified: Secondary | ICD-10-CM

## 2021-11-10 DIAGNOSIS — E039 Hypothyroidism, unspecified: Secondary | ICD-10-CM

## 2021-11-10 DIAGNOSIS — K219 Gastro-esophageal reflux disease without esophagitis: Secondary | ICD-10-CM

## 2021-11-10 DIAGNOSIS — R519 Chronic idiopathic facial pain: Secondary | ICD-10-CM

## 2021-11-10 DIAGNOSIS — I499 Cardiac arrhythmia, unspecified: Secondary | ICD-10-CM

## 2021-11-10 DIAGNOSIS — T7840XA Allergy, unspecified, initial encounter: Secondary | ICD-10-CM

## 2021-11-10 DIAGNOSIS — H269 Unspecified cataract: Secondary | ICD-10-CM

## 2021-11-10 DIAGNOSIS — J329 Chronic sinusitis, unspecified: Secondary | ICD-10-CM

## 2021-11-10 DIAGNOSIS — F419 Anxiety disorder, unspecified: Secondary | ICD-10-CM

## 2021-11-10 DIAGNOSIS — Z1231 Encounter for screening mammogram for malignant neoplasm of breast: Secondary | ICD-10-CM

## 2021-11-14 ENCOUNTER — Encounter: Admit: 2021-11-14 | Discharge: 2021-11-14 | Payer: MEDICARE | Primary: Geriatric Medicine

## 2021-11-19 ENCOUNTER — Encounter: Admit: 2021-11-19 | Discharge: 2021-11-19 | Payer: MEDICARE | Primary: Geriatric Medicine

## 2021-11-19 MED ORDER — PROPRANOLOL 60 MG PO CS24
ORAL_CAPSULE | 3 refills
Start: 2021-11-19 — End: ?

## 2021-12-06 ENCOUNTER — Encounter: Admit: 2021-12-06 | Discharge: 2021-12-06 | Payer: MEDICARE | Primary: Geriatric Medicine

## 2021-12-06 ENCOUNTER — Ambulatory Visit: Admit: 2021-12-06 | Discharge: 2021-12-07 | Payer: MEDICARE | Primary: Geriatric Medicine

## 2021-12-06 DIAGNOSIS — I499 Cardiac arrhythmia, unspecified: Secondary | ICD-10-CM

## 2021-12-06 DIAGNOSIS — E039 Hypothyroidism, unspecified: Secondary | ICD-10-CM

## 2021-12-06 DIAGNOSIS — K219 Gastro-esophageal reflux disease without esophagitis: Secondary | ICD-10-CM

## 2021-12-06 DIAGNOSIS — H547 Unspecified visual loss: Secondary | ICD-10-CM

## 2021-12-06 DIAGNOSIS — E559 Vitamin D deficiency, unspecified: Secondary | ICD-10-CM

## 2021-12-06 DIAGNOSIS — M549 Dorsalgia, unspecified: Secondary | ICD-10-CM

## 2021-12-06 DIAGNOSIS — E782 Mixed hyperlipidemia: Secondary | ICD-10-CM

## 2021-12-06 DIAGNOSIS — T7840XA Allergy, unspecified, initial encounter: Secondary | ICD-10-CM

## 2021-12-06 DIAGNOSIS — D72819 Decreased white blood cell count, unspecified: Secondary | ICD-10-CM

## 2021-12-06 DIAGNOSIS — I480 Paroxysmal atrial fibrillation: Secondary | ICD-10-CM

## 2021-12-06 DIAGNOSIS — I10 Essential (primary) hypertension: Secondary | ICD-10-CM

## 2021-12-06 DIAGNOSIS — R519 Chronic idiopathic facial pain: Secondary | ICD-10-CM

## 2021-12-06 DIAGNOSIS — F419 Anxiety disorder, unspecified: Secondary | ICD-10-CM

## 2021-12-06 DIAGNOSIS — G43109 Migraine with aura, not intractable, without status migrainosus: Secondary | ICD-10-CM

## 2021-12-06 DIAGNOSIS — E785 Hyperlipidemia, unspecified: Secondary | ICD-10-CM

## 2021-12-06 DIAGNOSIS — R0600 Dyspnea, unspecified: Secondary | ICD-10-CM

## 2021-12-06 DIAGNOSIS — J301 Allergic rhinitis due to pollen: Secondary | ICD-10-CM

## 2021-12-06 DIAGNOSIS — J329 Chronic sinusitis, unspecified: Secondary | ICD-10-CM

## 2021-12-06 DIAGNOSIS — M159 Polyosteoarthritis, unspecified: Secondary | ICD-10-CM

## 2021-12-06 DIAGNOSIS — R430 Anosmia: Secondary | ICD-10-CM

## 2021-12-06 DIAGNOSIS — E0590 Thyrotoxicosis, unspecified without thyrotoxic crisis or storm: Secondary | ICD-10-CM

## 2021-12-06 DIAGNOSIS — H269 Unspecified cataract: Secondary | ICD-10-CM

## 2021-12-07 DIAGNOSIS — G8929 Other chronic pain: Secondary | ICD-10-CM

## 2021-12-07 DIAGNOSIS — Z1211 Encounter for screening for malignant neoplasm of colon: Secondary | ICD-10-CM

## 2021-12-12 ENCOUNTER — Encounter: Admit: 2021-12-12 | Discharge: 2021-12-12 | Payer: MEDICARE | Primary: Geriatric Medicine

## 2021-12-15 ENCOUNTER — Ambulatory Visit: Admit: 2021-12-15 | Discharge: 2021-12-15 | Payer: MEDICARE | Primary: Geriatric Medicine

## 2021-12-15 ENCOUNTER — Encounter: Admit: 2021-12-15 | Discharge: 2021-12-15 | Payer: MEDICARE | Primary: Geriatric Medicine

## 2021-12-15 DIAGNOSIS — M792 Neuralgia and neuritis, unspecified: Secondary | ICD-10-CM

## 2021-12-15 DIAGNOSIS — H269 Unspecified cataract: Secondary | ICD-10-CM

## 2021-12-15 DIAGNOSIS — F419 Anxiety disorder, unspecified: Secondary | ICD-10-CM

## 2021-12-15 DIAGNOSIS — J301 Allergic rhinitis due to pollen: Secondary | ICD-10-CM

## 2021-12-15 DIAGNOSIS — E785 Hyperlipidemia, unspecified: Secondary | ICD-10-CM

## 2021-12-15 DIAGNOSIS — R519 Chronic idiopathic facial pain: Secondary | ICD-10-CM

## 2021-12-15 DIAGNOSIS — E039 Hypothyroidism, unspecified: Secondary | ICD-10-CM

## 2021-12-15 DIAGNOSIS — G43109 Migraine with aura, not intractable, without status migrainosus: Secondary | ICD-10-CM

## 2021-12-15 DIAGNOSIS — I499 Cardiac arrhythmia, unspecified: Secondary | ICD-10-CM

## 2021-12-15 DIAGNOSIS — H547 Unspecified visual loss: Secondary | ICD-10-CM

## 2021-12-15 DIAGNOSIS — U099 Long COVID: Secondary | ICD-10-CM

## 2021-12-15 DIAGNOSIS — T7840XA Allergy, unspecified, initial encounter: Secondary | ICD-10-CM

## 2021-12-15 DIAGNOSIS — E0590 Thyrotoxicosis, unspecified without thyrotoxic crisis or storm: Secondary | ICD-10-CM

## 2021-12-15 DIAGNOSIS — D72819 Decreased white blood cell count, unspecified: Secondary | ICD-10-CM

## 2021-12-15 DIAGNOSIS — M549 Dorsalgia, unspecified: Secondary | ICD-10-CM

## 2021-12-15 DIAGNOSIS — R0600 Dyspnea, unspecified: Secondary | ICD-10-CM

## 2021-12-15 DIAGNOSIS — G500 Trigeminal neuralgia: Secondary | ICD-10-CM

## 2021-12-15 DIAGNOSIS — R430 Anosmia: Secondary | ICD-10-CM

## 2021-12-15 DIAGNOSIS — I10 Essential (primary) hypertension: Secondary | ICD-10-CM

## 2021-12-15 DIAGNOSIS — J329 Chronic sinusitis, unspecified: Secondary | ICD-10-CM

## 2021-12-15 DIAGNOSIS — K219 Gastro-esophageal reflux disease without esophagitis: Secondary | ICD-10-CM

## 2021-12-15 MED ORDER — NORTRIPTYLINE 25 MG PO CAP
75 mg | ORAL_CAPSULE | Freq: Every evening | ORAL | 3 refills | Status: AC
Start: 2021-12-15 — End: ?

## 2021-12-18 ENCOUNTER — Encounter: Admit: 2021-12-18 | Discharge: 2021-12-18 | Payer: MEDICARE | Primary: Geriatric Medicine

## 2021-12-18 ENCOUNTER — Ambulatory Visit: Admit: 2021-12-18 | Discharge: 2021-12-19 | Payer: MEDICARE | Primary: Geriatric Medicine

## 2021-12-18 DIAGNOSIS — H269 Unspecified cataract: Secondary | ICD-10-CM

## 2021-12-18 DIAGNOSIS — K219 Gastro-esophageal reflux disease without esophagitis: Secondary | ICD-10-CM

## 2021-12-18 DIAGNOSIS — M549 Dorsalgia, unspecified: Secondary | ICD-10-CM

## 2021-12-18 DIAGNOSIS — F419 Anxiety disorder, unspecified: Secondary | ICD-10-CM

## 2021-12-18 DIAGNOSIS — E785 Hyperlipidemia, unspecified: Secondary | ICD-10-CM

## 2021-12-18 DIAGNOSIS — J309 Allergic rhinitis, unspecified: Secondary | ICD-10-CM

## 2021-12-18 DIAGNOSIS — R0600 Dyspnea, unspecified: Secondary | ICD-10-CM

## 2021-12-18 DIAGNOSIS — R519 Chronic idiopathic facial pain: Secondary | ICD-10-CM

## 2021-12-18 DIAGNOSIS — E0590 Thyrotoxicosis, unspecified without thyrotoxic crisis or storm: Secondary | ICD-10-CM

## 2021-12-18 DIAGNOSIS — I499 Cardiac arrhythmia, unspecified: Secondary | ICD-10-CM

## 2021-12-18 DIAGNOSIS — B999 Unspecified infectious disease: Secondary | ICD-10-CM

## 2021-12-18 DIAGNOSIS — G43109 Migraine with aura, not intractable, without status migrainosus: Secondary | ICD-10-CM

## 2021-12-18 DIAGNOSIS — I10 Essential (primary) hypertension: Secondary | ICD-10-CM

## 2021-12-18 DIAGNOSIS — J301 Allergic rhinitis due to pollen: Secondary | ICD-10-CM

## 2021-12-18 DIAGNOSIS — H547 Unspecified visual loss: Secondary | ICD-10-CM

## 2021-12-18 DIAGNOSIS — R430 Anosmia: Secondary | ICD-10-CM

## 2021-12-18 DIAGNOSIS — J329 Chronic sinusitis, unspecified: Secondary | ICD-10-CM

## 2021-12-18 DIAGNOSIS — J3489 Other specified disorders of nose and nasal sinuses: Secondary | ICD-10-CM

## 2021-12-18 DIAGNOSIS — T7840XA Allergy, unspecified, initial encounter: Secondary | ICD-10-CM

## 2021-12-18 DIAGNOSIS — D72819 Decreased white blood cell count, unspecified: Secondary | ICD-10-CM

## 2021-12-18 DIAGNOSIS — E039 Hypothyroidism, unspecified: Secondary | ICD-10-CM

## 2021-12-18 NOTE — Progress Notes
Date of Service: 12/18/2021       Subjective:  I had the pleasure of seeing Jasmine Lynch who presents today to the Allergy & Immunology clinic at the [facility] for an Initial Visit.  She was referred by Dr. Windle Guard.  The PCP is Dr.Zwahlen, Darlis Loan.  As you know, she is a 66 y.o. female with reflux, hypertension, chronic sinusitis, allergic rhinitis, anxiety, back pain, hyperlipidemia, migraines.      Chief Complaint   Patient presents with   ? New Patient   ? Runny Nose   ? Itchy Eye       History of Present Illness:     She has had seasonal allergies for all of her life. She has nose and eye symptoms. The most bothersome nose symptoms are nasal congestion, sneezing, thick mucus nasal drainage, posterior drainage that causes itchy throat. She wakes up every morning with eye crusting and itchy eyes. She has these symptoms all year round, worse in summer, spring and fall. She has the same symptoms inside as well as when she is outdoors. She has not found that animals worsen her   Around the age of 25 years, she saw Allergist Dr. Layla Barter and was found to be positive to pollens, dog, cat, mold, dust mites. She had started Allergy shots with him and was on it for at least 20 years. She then stopped it. While she was on it she found it helpful. She then followed up with an Allergist at [facility] [place] after Dr. Layla Barter passed away and on repeat allergy testing twice (last done in 2015) she was negative for all tested allergens. She has not had any repeat allergy testing since then. She cannot recall the name of the Allergist but did not follow up with them since the repeat allergy testing.     She retired in 2015.   She has not had any change in her home environment. She has lived in the same home for the last 27 years. She has no pets at home. She does have carpet in her bedroom. She does not have feathers in pillows.     At this time, her current medication regimen is Claritin 10 mg, Mucinex DM BID. She stopped this when we had the first hard freeze. She is only using Nasacort 2 SPEN once daily. She has been tolerating the medication well without any side effects. But she has not found it to be helpful. She still has a lot of drainage. She feels it has worsened over the last few years without any improvement despite trying different medication regimens.     She follows with ENT specialist, Dr. Teresa Pelton. She had a septoplasty in 2008. He recommended she continue on the Nasacort nasal spray.     No history of eczema, asthma, food allergies. She thinks she is lactose intolerant as every time she consumes dairy she will have nasal congestion the following day. She denies any swelling, abdominal pain, diarrhea, hives with ingestion of dairy.   No history of medication allergies.     She has a history of frequent sinus infections due to the rhinitis symptoms. She gets 1-2/ year. Last was 6-7 months ago. 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 It did get worse after Covid-19. No history of nasal polyps. No history of nasal trauma. No history of nosebleeds.     She has hypothyroidism and takes levothyroxine.   For her migraines on Aimovig.   Was prescribed  nortriptyline for facial pain.   On trazodone for sleep.   No family history of hay fever, asthma.         Medical History:   Diagnosis Date   ? Acid reflux 1990   ? Allergy 1980    Last tested 2015 - told all gone but I?m skeptical   ? 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 Anxiety 04/10/2011   ? Arrhythmia    ? Back pain 2015   ? Cataract 2020    Last checked March 2023   ? 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 Chronic sinusitis 09/20/2021   ? Dyspnea    ? Hay fever    ? HTN (hypertension)    ? Hyperlipidemia 02/28/2017   ? Hyperthyroidism 04/10/2011   ? Hypothyroidism (acquired) 04/10/2011    1999 - Variability in thyroid levels. 2013 - Normal TSH   ? Leukocytopenia, unspecified 05/10/2021   ? Migraine with aura and without status migrainosus, not intractable 11/25/2013   ? Non-seasonal allergic rhinitis due to pollen 05/10/2021    Sees ENT   ? Recurrent infections     Sinus infections   ? Sinus infection    ? Vision decreased 2020     Surgical History:   Procedure Laterality Date   ? HX OOPHORECTOMY Bilateral 1993   ? COLONOSCOPY  2009?   ? DOPPLER ECHOCARDIOGRAPHY     ? ELECTROCARDIOGRAM     ? HX HYSTERECTOMY  1993   ? HX TONSILLECTOMY  1960   ? SINUS SURGERY  2008?     Family History   Problem Relation Age of Onset   ? Alzheimer's Mother    ? Hypertension Father    ? Kidney Disease Father    ? Back pain Father    ? Stroke Brother    ? Stroke Paternal Aunt      Social History     Socioeconomic History   ? Marital status: Married   Tobacco Use   ? Smoking status: Never     Passive exposure: Past   ? Smokeless tobacco: Never   Substance and Sexual Activity   ? Alcohol use: Not Currently     Comment: very rarely   ? Drug use: Never   ? Sexual activity: Not Currently     Partners: Male     Birth control/protection: None                     Asthma Control Test  #1. In the past 4 weeks, how much of the time did your asthma keep you from getting as much done at work, school or at home?: 3 (12/11/2021 11:33 AM)  #2. During the past 4 weeks, how often have you had shortness of breath?: 4-Once or twice a week (12/11/2021 11:33 AM)  #3. During the past 4 weeks, how often did your asthma symptoms (wheezing, coughing, shortness of breath, chest tightness or pain) wake you up at night or earlier than usual in the morning?: 5 (12/11/2021 11:33 AM)  #4. During the past 4 weeks, how often have you used your rescue inhaler or nebulizer medication (such as albuterol)?: 5 (12/11/2021 11:33 AM)  #5. How would you rate your asthma control during the past 4 weeks?: 4 (12/11/2021 11:33 AM)  Total Score: 21 (12/11/2021 11:33 AM)       Rhinitis Control Assessment Test  #1. During the past week, how often did you have nasal congestion?: 2 (12/11/2021 11:33 AM)  #2. During  the past week, how often did you sneeze?: 2 (12/11/2021 11:33 AM)  #3. During the past week, how often did you have watery eyes?: 2 (12/11/2021 11:33 AM)  #4. During the past week, to what extent did your nasal or other allergy symptoms interfere with your sleep?: 3 (12/11/2021 11:33 AM)  #5. During the past week, how well were your nasal or other allergy symptoms controlled?: 3 (12/11/2021 11:33 AM)  #6. During the past week, how often did you avoid any activities (for example, visiting a house with a dog or cat, or gardening) because of your nasal or other allergy symptoms?: 4 (12/11/2021 11:33 AM)  RCAT Total Score: 16 (12/11/2021 11:33 AM)               Review of Systems  A 14 point review of systems has been reviewed and the remainder are all negative other than as noted above and as in the HPI.  Any chronic issues are being addressed by a variety of physicians other than as is noted in the HPI.      Objective:         Medications  ? ALPRAZolam (XANAX) 0.25 mg tablet Take one tablet by mouth at bedtime as needed.   ? aspirin 81 mg chewable tablet Chew one tablet by mouth daily.     ? COQ10 (UBIQUINOL) PO Take 400 mg by mouth daily.   ? erenumab-aooe (AIMOVIG) 140 mg/mL injection Inject 1 mL under the skin every 28 days.   ? FLAXSEED OIL PO Take 1,200 mg by mouth daily.     ? glucosamine HCl/chondroitin su (GLUCOSAMINE-CHONDROITIN PO) Take 2 tablets by mouth daily.   ? Lactobacillus acidophilus (PROBIOTIC PO) Take 1 capsule by mouth daily.   ? levothyroxine (SYNTHROID) 25 mcg tablet TAKE 1 TABLET BY MOUTH DAILY   ? magnesium citrate 125 mg cap Take 1 capsule by mouth twice daily.   ? multivitamin/iron/folic acid (CENTRUM WOMEN PO) Take 1 tablet by mouth daily.   ? nortriptyline (PAMELOR) 25 mg capsule Take three capsules by mouth at bedtime daily. 1 cap qhs x5d, then 2 caps x5d, then 3 caps qhs   ? OYSTER SHELL CALCIUM-VIT D3 500 mg-5 mcg (200 unit) tablet Take one tablet by mouth twice daily.   ? pantoprazole DR (PROTONIX) 40 mg tablet Take one tablet by mouth daily.   ? polypodium leucoto/niacinamide (HELIOCARE ADVANCED PO) Take 2 capsules by mouth daily.   ? propranolol LA (INDERAL LA) 60 mg capsule TAKE 1 CAPSULE BY MOUTH TWICE A DAY   ? rosuvastatin (CRESTOR) 20 mg tablet TAKE ONE TABLET BY MOUTH TWICE WEEKLY.   ? traZODone (DESYREL) 50 mg tablet Take three tablets by mouth at bedtime daily.   ? triamcinolone (NASACORT) 55 mcg nasal inhaler Apply one spray to each nostril as directed twice daily.     No Known Allergies  Vitals:    12/18/21 1310   BP: 126/73   BP Source: Arm, Right Upper   Pulse: 65   Temp: 36.3 ?C (97.4 ?F)   TempSrc: Oral   PainSc: Zero   Weight: 90.7 kg (200 lb)  Comment: pER PT   Height: 175.3 cm (5' 9)           Physical Exam  Vitals reviewed.   Constitutional:       General: She is not in acute distress.     Appearance: Normal appearance. She is not ill-appearing.   HENT:      Head: Normocephalic  and atraumatic.      Right Ear: Tympanic membrane, ear canal and external ear normal.      Left Ear: Tympanic membrane, ear canal and external ear normal.      Nose: Nose normal. No congestion or rhinorrhea.      Comments: Posterior septal perforation     Mouth/Throat:      Mouth: Mucous membranes are moist.      Pharynx: Oropharynx is clear. No oropharyngeal exudate or posterior oropharyngeal erythema.   Eyes:      General:         Right eye: No discharge.         Left eye: No discharge.      Extraocular Movements: Extraocular movements intact.      Conjunctiva/sclera: Conjunctivae normal.      Pupils: Pupils are equal, round, and reactive to light.   Cardiovascular:      Rate and Rhythm: Normal rate and regular rhythm.      Pulses: Normal pulses.      Heart sounds: Normal heart sounds. No murmur heard.  Pulmonary:      Effort: Pulmonary effort is normal. No respiratory distress.      Breath sounds: Normal breath sounds. No wheezing.   Skin:     General: Skin is warm and dry.      Capillary Refill: Capillary refill takes less than 2 seconds. Findings: No erythema or rash.   Neurological:      General: No focal deficit present.      Mental Status: She is alert and oriented to person, place, and time.   Psychiatric:         Mood and Affect: Mood normal.         Behavior: Behavior normal.                      Assessment and Plan:  Problem   Allergic Rhinoconjunctivitis    Hx of rhinoconjunctivitis symptoms for years that are present year round and worse in Spring, Summer and Fall. She had seen an Allergist in her 20's and found to have positive aeroallergen testing, started on Allergy shots and remained on them for 20 years due to benefit. Repeat testing at [facility] in 2015 was negative. Currently, symptoms poorly controlled on current medication regimen. She had a septoplasty in 2008 with Dr. Gordy Levan; no further sinus surgeries. 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     Family Hx negative for similar rhinitis symptoms.   Pet Hx: No pets at home.     Current medication: Nasacort 2 SPEN once daily. Improper technique.     Impression:  Allergic vs non allergic rhinoconjunctivitis vs mixed.   We discussed that although she does have a history of allergic rhinitis, the differential diagnoses for rhinitis is broad and include allergic rhinitis, non-allergic rhinitis/vasomotor rhinitis, gustatory rhinitis, allergic fungal rhinosinusitis, chronic rhinosinusitis, ANCA-associated vasculitis, migraine or other headache disorder, dental decay/dental disease affecting sinuses, trauma related rhinitis, hormonal rhinitis, and/or other etiologies.     Based on her history and other medical conditions, it is likely that her rhinitis is multifactorial.     We discussed with patient that it is reasonable we can obtain repeat aeroallergen SPT. This will be helpful to see what aeroallergens she is sensitized to and provide guidance for aeroallergen avoidance measures. It will also help determine if she is a candidate for Allergy shots, if  she decided to pursue this in the future. Patient would like to proceed with allergy testing. Will need to schedule future visit for testing as she took trazodone yesterday. She has not started the nortriptyline and will hold off until our testing.     Plan:   -  Continue Nasacort 2 puffs each nostril once daily. Point nozzle outwards to help prevent nosebleeds. Demonstrated appropriate technique for use.   - Will plan for aeroallergen SPT on 01/01/22 at 10.20 am.   - Skin testing instructions provided in AVS.      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 No history of recent trauma, surgery. No epistaxis. Discussed with patient that the nasal septum perforation is most likely due to her septoplasty. However, it is best for her to follow up with her ENT specialist as this may not be a new finding. Asymptomatic at this time. Counseled on appropriate use of nasal spray.                         RTC on 12/01/21 for aeroallergen SPT.  Thank you for allowing [place] to participate in this patient's care. Please feel free to contact us with any questions or concerns.    Greer Pickerel, MD  Assistant Clinical Professor   Division of Allergy, Immunology, and Rheumatology  Department of Internal Medicine and Department of Pediatrics  [facility]

## 2021-12-18 NOTE — Patient Instructions
.  aiurskin

## 2021-12-19 ENCOUNTER — Encounter: Admit: 2021-12-19 | Discharge: 2021-12-19 | Payer: MEDICARE | Primary: Geriatric Medicine

## 2021-12-19 DIAGNOSIS — Z889 Allergy status to unspecified drugs, medicaments and biological substances status: Secondary | ICD-10-CM

## 2022-01-01 ENCOUNTER — Encounter: Admit: 2022-01-01 | Discharge: 2022-01-01 | Payer: MEDICARE | Primary: Geriatric Medicine

## 2022-01-01 ENCOUNTER — Ambulatory Visit: Admit: 2022-01-01 | Discharge: 2022-01-02 | Payer: MEDICARE | Primary: Geriatric Medicine

## 2022-01-01 DIAGNOSIS — I499 Cardiac arrhythmia, unspecified: Secondary | ICD-10-CM

## 2022-01-01 DIAGNOSIS — J309 Allergic rhinitis, unspecified: Secondary | ICD-10-CM

## 2022-01-01 DIAGNOSIS — R430 Anosmia: Secondary | ICD-10-CM

## 2022-01-01 DIAGNOSIS — J301 Allergic rhinitis due to pollen: Secondary | ICD-10-CM

## 2022-01-01 DIAGNOSIS — E039 Hypothyroidism, unspecified: Secondary | ICD-10-CM

## 2022-01-01 DIAGNOSIS — T7840XA Allergy, unspecified, initial encounter: Secondary | ICD-10-CM

## 2022-01-01 DIAGNOSIS — B999 Unspecified infectious disease: Secondary | ICD-10-CM

## 2022-01-01 DIAGNOSIS — K219 Gastro-esophageal reflux disease without esophagitis: Secondary | ICD-10-CM

## 2022-01-01 DIAGNOSIS — I10 Essential (primary) hypertension: Secondary | ICD-10-CM

## 2022-01-01 DIAGNOSIS — D72819 Decreased white blood cell count, unspecified: Secondary | ICD-10-CM

## 2022-01-01 DIAGNOSIS — E0590 Thyrotoxicosis, unspecified without thyrotoxic crisis or storm: Secondary | ICD-10-CM

## 2022-01-01 DIAGNOSIS — R519 Chronic idiopathic facial pain: Secondary | ICD-10-CM

## 2022-01-01 DIAGNOSIS — G43109 Migraine with aura, not intractable, without status migrainosus: Secondary | ICD-10-CM

## 2022-01-01 DIAGNOSIS — H269 Unspecified cataract: Secondary | ICD-10-CM

## 2022-01-01 DIAGNOSIS — H547 Unspecified visual loss: Secondary | ICD-10-CM

## 2022-01-01 DIAGNOSIS — F419 Anxiety disorder, unspecified: Secondary | ICD-10-CM

## 2022-01-01 DIAGNOSIS — J329 Chronic sinusitis, unspecified: Secondary | ICD-10-CM

## 2022-01-01 DIAGNOSIS — M549 Dorsalgia, unspecified: Secondary | ICD-10-CM

## 2022-01-01 DIAGNOSIS — R0600 Dyspnea, unspecified: Secondary | ICD-10-CM

## 2022-01-01 DIAGNOSIS — E785 Hyperlipidemia, unspecified: Secondary | ICD-10-CM

## 2022-01-01 MED ORDER — IPRATROPIUM BROMIDE 42 MCG (0.06 %) NA SPRY
2 | Freq: Three times a day (TID) | NASAL | 3 refills | 30.00000 days | Status: AC | PRN
Start: 2022-01-01 — End: ?

## 2022-01-01 NOTE — Progress Notes
Date of Service: 01/01/2022  Date of last contact with Allergy/Immunology: 12/18/2021  Date of last office visit encounter with Dr.D'Mello: 12/18/2021         Subjective:  I had the pleasure of seeing Jasmine Lynch who presents today to the [facility] for aeroallergen skin prick testing.  As you know, she is a 66 y.o. female with reflux, hypertension, chronic sinusitis, allergic rhinitis, anxiety, back pain, hyperlipidemia, migraines.      Chief Complaint   Patient presents with   ? Office Visit Follow Up   ? Runny Nose       History of Present Illness:   Today, she presents to clinic for aeroallergen skin testing. She is feeling well today. No ER/UC/unplanned PCP visits since our last visit. No fevers, cough, chest pain, difficulty breathing, shortness of breath, dizziness, rashes, lesions, pruritus today. Some nasal congestion today but denies any post nasal drainage or runny nose.      She has been off all required medications for the recommended duration to proceed with allergy testing. Reviewed aeroallergen skin testing process and potential adverse reactions, including signs and symptoms of a severe allergic reaction.  Pt verbalized understanding and all questions were answered. E-consent was signed by pt and attached to chart.      There were no new changes to the patient's medical history, surgical history, family medical and social/environmental history.    Rhinitis Control Assessment Test  #1. During the past week, how often did you have nasal congestion?: 2 (12/11/2021 11:33 AM)  #2. During the past week, how often did you sneeze?: 2 (12/11/2021 11:33 AM)  #3. During the past week, how often did you have watery eyes?: 2 (12/11/2021 11:33 AM)  #4. During the past week, to what extent did your nasal or other allergy symptoms interfere with your sleep?: 3 (12/11/2021 11:33 AM)  #5. During the past week, how well were your nasal or other allergy symptoms controlled?: 3 (12/11/2021 11:33 AM)  #6. During the past week, how often did you avoid any activities (for example, visiting a house with a dog or cat, or gardening) because of your nasal or other allergy symptoms?: 4 (12/11/2021 11:33 AM)  RCAT Total Score: 16 (12/11/2021 11:33 AM)      Asthma Control Test  #1. In the past 4 weeks, how much of the time did your asthma keep you from getting as much done at work, school or at home?: 3 (12/11/2021 11:33 AM)  #2. During the past 4 weeks, how often have you had shortness of breath?: 4-Once or twice a week (12/11/2021 11:33 AM)  #3. During the past 4 weeks, how often did your asthma symptoms (wheezing, coughing, shortness of breath, chest tightness or pain) wake you up at night or earlier than usual in the morning?: 5 (12/11/2021 11:33 AM)  #4. During the past 4 weeks, how often have you used your rescue inhaler or nebulizer medication (such as albuterol)?: 5 (12/11/2021 11:33 AM)  #5. How would you rate your asthma control during the past 4 weeks?: 4 (12/11/2021 11:33 AM)  Total Score: 21 (12/11/2021 11:33 AM)                Review of Systems    A 14 point review of systems has been reviewed and the remainder are all negative other than as noted above and as in the HPI.  Any chronic issues are being addressed by a variety of physicians other than as  is noted in the HPI.    Objective:         Medications  ? ALPRAZolam (XANAX) 0.25 mg tablet Take one tablet by mouth at bedtime as needed.   ? aspirin 81 mg chewable tablet Chew one tablet by mouth daily.     ? COQ10 (UBIQUINOL) PO Take 400 mg by mouth daily.   ? erenumab-aooe (AIMOVIG) 140 mg/mL injection Inject 1 mL under the skin every 28 days.   ? FLAXSEED OIL PO Take 1,200 mg by mouth daily.     ? glucosamine HCl/chondroitin su (GLUCOSAMINE-CHONDROITIN PO) Take 2 tablets by mouth daily.   ? ipratropium bromide (ATROVENT) 42 mcg (0.06 %) nasal spray Apply two sprays to each nostril as directed three times daily as needed. Indications: chronic stuffy and runny nose not caused by allergies, runny nose   ? Lactobacillus acidophilus (PROBIOTIC PO) Take 1 capsule by mouth daily.   ? levothyroxine (SYNTHROID) 25 mcg tablet TAKE 1 TABLET BY MOUTH DAILY   ? magnesium citrate 125 mg cap Take 1 capsule by mouth twice daily.   ? multivitamin/iron/folic acid (CENTRUM WOMEN PO) Take 1 tablet by mouth daily.   ? nortriptyline (PAMELOR) 25 mg capsule Take three capsules by mouth at bedtime daily. 1 cap qhs x5d, then 2 caps x5d, then 3 caps qhs   ? OYSTER SHELL CALCIUM-VIT D3 500 mg-5 mcg (200 unit) tablet Take one tablet by mouth twice daily.   ? pantoprazole DR (PROTONIX) 40 mg tablet Take one tablet by mouth daily.   ? polypodium leucoto/niacinamide (HELIOCARE ADVANCED PO) Take 2 capsules by mouth daily.   ? propranolol LA (INDERAL LA) 60 mg capsule TAKE 1 CAPSULE BY MOUTH TWICE A DAY   ? rosuvastatin (CRESTOR) 20 mg tablet TAKE ONE TABLET BY MOUTH TWICE WEEKLY.   ? traZODone (DESYREL) 50 mg tablet Take three tablets by mouth at bedtime daily.   ? triamcinolone (NASACORT) 55 mcg nasal inhaler Apply one spray to each nostril as directed twice daily.     No Known Allergies  Vitals:    01/01/22 0947   BP: (!) 153/85   Pulse: 68   Temp: 36.3 ?C (97.4 ?F)   Resp: 20   SpO2: 99%   PainSc: Eight   Weight: 90.7 kg (200 lb)   Height: 172.7 cm (5' 8)     The patient's BP is elevated and, because we are specialists not managing elevated BP, the patient is referred back to Primary Care Provider for follow up of this problem.        Physical Exam  Vitals reviewed.   Constitutional:       General: She is not in acute distress.     Appearance: Normal appearance. She is not ill-appearing.   HENT:      Right Ear: Tympanic membrane, ear canal and external ear normal.      Left Ear: Tympanic membrane, ear canal and external ear normal.      Nose: Nose normal. No congestion or rhinorrhea.      Mouth/Throat:      Mouth: Mucous membranes are moist.      Pharynx: Oropharynx is clear. No oropharyngeal exudate or posterior oropharyngeal erythema.   Eyes:      General:         Right eye: No discharge.         Left eye: No discharge.      Extraocular Movements: Extraocular movements intact.      Conjunctiva/sclera: Conjunctivae  normal.      Pupils: Pupils are equal, round, and reactive to light.   Cardiovascular:      Rate and Rhythm: Normal rate and regular rhythm.      Pulses: Normal pulses.      Heart sounds: Normal heart sounds. No murmur heard.  Pulmonary:      Effort: Pulmonary effort is normal. No respiratory distress.      Breath sounds: Normal breath sounds. No wheezing.   Skin:     General: Skin is warm and dry.      Capillary Refill: Capillary refill takes less than 2 seconds.      Findings: No erythema or rash.   Neurological:      General: No focal deficit present.      Mental Status: She is alert and oriented to person, place, and time.   Psychiatric:         Mood and Affect: Mood normal.         Behavior: Behavior normal.               Testing Information  Consent Obtained: Yes  Time Antigen Placed: 1037  Needle Type: Multi-Test II Device  Test Location: Back  Antigen Manufacturer: [employer]  Testing Nurse: Darlyne Russian, RN  Reviewing Physician: Greer Pickerel, MD  Time Test Read: 1052    Controls  1. Dil w/50% Glycerin (HS): 0/0  2. Histamine dihydrochloride: 6/6    Trees  Ash, White 1:20 (HS): 0/0  Birch Mix 1:20 (HS): 0/0  Box Elder 1:20 (HS): 0/0  Cedar, Mountain 1:20 (HS): 0/0  Cottonwood 1:20 (HS): 3/3  Elm, Chinese 1:20 (HS): 0/0  Maple Mix 1:20 (HS): 0/0  Mulberry Mix 1:20 (HS): 0/0  Oak Mix 1:20 (HS): 0/0  Pine Mix 1:20 (HS): 3/3  Sweetgum 1:20 (HS): 0/0  Sycamore, American 1:20 (HS): 0/0  Walnut, Black 1:20 (HS): 0/0    Grasses  [place] 10,000 BAU/ml (HS): 0/0  Johnson 1:20 (HS): 0/0  Sweet Vernal 100,000 BAU/ml (HS): 0/0  Timothy 100,000 BAU/ml (HS): 0/0     Weeds  Careless/Pigweed Mix 1:20 (HS): 0/0  Cocklebur 1:20 (HS): 0/0  Dock/Sorrel Mix 1:20 (HS): 0/0  Kochia 1:20 (HS): 0/0  Marsh Elder 1:20 (HS): 0/0  Nettle 1:20 (HS): 0/0  Plantain, English 1:20 (HS): 0/0  Ragweed, Short 1:20 (HS): 0/0  Ragweed, Western 1:20 (HS): 0/0  [place] Thistle 1:20 (HS): 0/0  Sagebrush/Mugwort 1:20 (HS): 0/0    Mites and Cockroaches  Cockroach, American 1:10 (HS): 0/0  Cockroach, German 1:10 (HS): 0/0  Derm. Farinae 30,000 AU/ml (HS): 0/0  Derm. Pteronyssinus 30,000 AU/ml (HS): 0/0    Animals  Cat, AP Pelt 10,000 BAU/ml (HS): 0/0  Dog, AP Hair and Dander 1:10 (HS): 0/0  Feather Mix 1:10 (HS): 0/0    Molds and Fungi  Alternaria Tenuis 1:10 (HS): 0/0  Aspergillus Fumigatus 1:10 (HS): 0/0  Epicoccum Nigrum 1:10 (HS): 0/0  Fusarium Vasinfectum 1:10 (HS): 0/0  Helminthosporium Inters. 1:10 (HS): 0/0  Horm. Cladosporioides 1:10 (HS): 0/0  Mucor Racemosus 1:10 (HS): 0/0  Penicillium [place]urn 1:10 (HS): 0/0  Histamine Phosphate 10mg /ml (HS): 6/6                  Assessment and Plan:  Problem   Allergic Rhinoconjunctivitis    01/01/22: Aeroallergen SPT was positive to two trees with appropriate control response.     Allergic rhinoconjunctivitis education:   Reviewed testing results. Results provides an explanation for patients' symptom during  the spring. 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   Reviewed treatment goals of prevention of symptoms: (1) aeroallergen avoidance measures (2) medication management (3) allergen immunotherapy.   Reviewed aeroallergen avoidance measures.   We discussed aeroallergen immunotherapy but at this time, patient would like to focus on avoidance measures and medication management.      Medication review:   Discussed current medication dosage, use, side effects.   Reviewed appropriate technique for nasal spray use.      Patient questions and concerns were addressed at this visit.       Plan:   - Continue Nasacort 2 puffs each nostril once daily. Point nozzle outwards to help prevent nosebleeds.   - Start Atrovent (ipratropium) 1-2 puffs in each nostril up to three times a day as needed for sneezing, post nasal drip, runny nose. Point nozzle outwards to help prevent nosebleeds.  - Can add nasal saline spray as needed to help reduce drying of nasal passages. Best to use before prescription nasal spray.   - Implement aeroallergen avoidance measures as discussed. Provided in AVS.               RTC in 3 month.  Thank you for allowing [place] to participate in this patient's care. Please feel free to contact us with any questions or concerns.    Greer Pickerel, MD  Assistant Clinical Professor   Division of Allergy, Immunology, and Rheumatology  Department of Internal Medicine and Department of Pediatrics  [facility]             Total Time Today was 15 minutes in the following activities: Preparing to see the patient, Obtaining and/or reviewing separately obtained history, Performing a medically appropriate examination and/or evaluation, Counseling and educating the patient, Ordering medications, Documenting clinical information in the electronic or other health record, Independently interpreting results (not separately reported) and communicating results to the patient and Care coordination (not separately reported)

## 2022-01-09 ENCOUNTER — Encounter: Admit: 2022-01-09 | Discharge: 2022-01-09 | Payer: MEDICARE | Primary: Geriatric Medicine

## 2022-01-09 MED ORDER — NORTRIPTYLINE 75 MG PO CAP
75 mg | ORAL_CAPSULE | Freq: Every evening | ORAL | 1 refills | Status: AC
Start: 2022-01-09 — End: ?

## 2022-01-16 NOTE — Patient Instructions
Thank you for choosing to let us care for you today.  Please read through the following information that will help us continue to provide the best care possible.     -- Preferred method of communication is through MyChart message, if the issue cannot wait until your next scheduled follow up.   -- MyChart may be used for non-emergent communication. Emails are not reviewed after hours or over the weekend/holidays/after 4PM. Staff will reply to your email within 24-48 business hours.       -- If you do not hear from us within one week of a lab or imaging study being completed, please call/send my chart email to the office to be sure that we have received the results. This is especially challenging when tests are done outside of the [facility] system, as many times results do not make it back to our office for a variety of reasons. In our office no news is good news does not apply. You should hear from us with results for each test.  [name] lab/imaging results:  Due to the CARES act, results automatically release to MyChart.  Dr. Kawano-Castillo will continue to send you a result note on any labs that he orders.  With these changes you may see your results before Dr. Kawano-Castillo does.   Please allow up to 72 hours for review and response to your results.     -- If you are having acute (new/sudden onset) or severe/worsening neurologic symptoms, please call 911 or seek care in ED.    -- For scheduling of IMAGING/RADIOLOGY, please call [phone] at your convenience to schedule your studies.  -- For referrals placed during the visit, if you have not heard from scheduling within one week, please call the call center at [phone] to get scheduling assistance.  -- For refills on medications, please first contact your pharmacy, who will fax a refill authorization request form to our office.  Weekdays only. Allow up to 2 business days for refills. Please plan ahead, as refills will not be filled after hours.  -- For scheduling of PT/OT referrals placed during the visit, please call [phone].    -- Our scheduling staff may be reached at [phone] opt. 1 for scheduling needs.     -- Dr. Kawano's nurse may be contacted at [phone] for urgent needs. Staff will return your call within 24 business hours.     For Appointments:   -- Please try to arrive early for your appointment time to help facilitate your visit. 15 minutes early is recommended.   -- If you are late to your appointment, we reserve the right to ask you to reschedule or wait until next available time to be seen in fairness to other patients scheduled that day.   -- There are times when we are running behind in clinic. Our goal is to always be on time, however, there are times when unexpected events occur with patients, which may cause a delay. We appreciate your understanding when this occurs.

## 2022-01-17 ENCOUNTER — Encounter: Admit: 2022-01-17 | Discharge: 2022-01-17 | Payer: MEDICARE | Primary: Geriatric Medicine

## 2022-01-17 NOTE — Telephone Encounter
Patient contacted for pre-visit information gathering r/t referral for migraines (began about 12 years ago after having sinus surgery       Referring provider: Dr Guy Begin        Confirmed appointment for 01/25/22 at 900a.  Also confirmed that patient aware of clinic location.

## 2022-01-25 ENCOUNTER — Encounter: Admit: 2022-01-25 | Discharge: 2022-01-25 | Payer: MEDICARE | Primary: Geriatric Medicine

## 2022-01-25 ENCOUNTER — Ambulatory Visit: Admit: 2022-01-25 | Discharge: 2022-01-26 | Payer: MEDICARE | Primary: Geriatric Medicine

## 2022-01-25 DIAGNOSIS — M549 Dorsalgia, unspecified: Secondary | ICD-10-CM

## 2022-01-25 DIAGNOSIS — I4891 Unspecified atrial fibrillation: Secondary | ICD-10-CM

## 2022-01-25 DIAGNOSIS — R0600 Dyspnea, unspecified: Secondary | ICD-10-CM

## 2022-01-25 DIAGNOSIS — E0590 Thyrotoxicosis, unspecified without thyrotoxic crisis or storm: Secondary | ICD-10-CM

## 2022-01-25 DIAGNOSIS — J329 Chronic sinusitis, unspecified: Secondary | ICD-10-CM

## 2022-01-25 DIAGNOSIS — J301 Allergic rhinitis due to pollen: Secondary | ICD-10-CM

## 2022-01-25 DIAGNOSIS — R519 Chronic idiopathic facial pain: Secondary | ICD-10-CM

## 2022-01-25 DIAGNOSIS — E039 Hypothyroidism, unspecified: Secondary | ICD-10-CM

## 2022-01-25 DIAGNOSIS — I10 Essential (primary) hypertension: Secondary | ICD-10-CM

## 2022-01-25 DIAGNOSIS — F419 Anxiety disorder, unspecified: Secondary | ICD-10-CM

## 2022-01-25 DIAGNOSIS — Z780 Asymptomatic menopausal state: Secondary | ICD-10-CM

## 2022-01-25 DIAGNOSIS — G43109 Migraine with aura, not intractable, without status migrainosus: Secondary | ICD-10-CM

## 2022-01-25 DIAGNOSIS — I480 Paroxysmal atrial fibrillation: Secondary | ICD-10-CM

## 2022-01-25 DIAGNOSIS — T7840XA Allergy, unspecified, initial encounter: Secondary | ICD-10-CM

## 2022-01-25 DIAGNOSIS — R430 Anosmia: Secondary | ICD-10-CM

## 2022-01-25 DIAGNOSIS — B999 Unspecified infectious disease: Secondary | ICD-10-CM

## 2022-01-25 DIAGNOSIS — H547 Unspecified visual loss: Secondary | ICD-10-CM

## 2022-01-25 DIAGNOSIS — K219 Gastro-esophageal reflux disease without esophagitis: Secondary | ICD-10-CM

## 2022-01-25 DIAGNOSIS — E785 Hyperlipidemia, unspecified: Secondary | ICD-10-CM

## 2022-01-25 DIAGNOSIS — I499 Cardiac arrhythmia, unspecified: Secondary | ICD-10-CM

## 2022-01-25 DIAGNOSIS — E782 Mixed hyperlipidemia: Secondary | ICD-10-CM

## 2022-01-25 DIAGNOSIS — H269 Unspecified cataract: Secondary | ICD-10-CM

## 2022-01-25 DIAGNOSIS — D72819 Decreased white blood cell count, unspecified: Secondary | ICD-10-CM

## 2022-01-25 NOTE — Progress Notes
Date of Service: 01/25/2022    Jasmine Lynch is a 66 y.o. female.       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  She reports that she continues to have occasional brief episodes of palpitations that last perhaps a minute or 2.  She does not really have any other associated symptoms and so far this is not particularly bothersome to her.  She has moved on from yoga and is now doing a video workout 6 days/week that she thinks has been very helpful.    She denies any problems with exertional chest discomfort or breathlessness.  She has had no trouble with TIA or stroke symptoms.  She denies any syncope or near syncope.         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 She had seen an Allergist in her 20's and found to have positive aeroallergen testing, started on Allergy shots and remained on them for 20 years due to benefit. Repeat testing at [facility] in 2015 was negative. Currently, symptoms poorly controlled on current medication regimen. She had a septoplasty in 2008 with Dr. Gordy Levan; no further sinus surgeries. 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     Family Hx negative for similar rhinitis symptoms.   Pet Hx: No pets at home.     Current medication: Nasacort 2 SPEN once daily. Improper technique.     Impression:  Allergic vs non allergic rhinoconjunctivitis vs mixed.   We discussed that although she does have a history of allergic rhinitis, the differential diagnoses for rhinitis is broad and include allergic rhinitis, non-allergic rhinitis/vasomotor rhinitis, gustatory rhinitis, allergic fungal rhinosinusitis, chronic rhinosinusitis, ANCA-associated vasculitis, migraine or other headache disorder, dental decay/dental disease affecting sinuses, trauma related rhinitis, hormonal rhinitis, and/or other etiologies.     01/01/22: Aeroallergen SPT was positive to two trees with appropriate control response.     Allergic rhinoconjunctivitis education:   Reviewed testing results. Results provides an explanation for patients' symptom during the spring. 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   Reviewed treatment goals of prevention of symptoms: (1) aeroallergen avoidance measures (2) medication management (3) allergen immunotherapy.   Reviewed aeroallergen avoidance measures.   We discussed aeroallergen immunotherapy but at this time, patient would like to focus on avoidance measures and medication management.      Medication review:   Discussed current medication dosage, use, side effects.   Reviewed appropriate technique for nasal spray use.      Patient questions and concerns were addressed at this visit.       Plan:   - Continue Nasacort 2 puffs each nostril once daily. Point nozzle outwards to help prevent nosebleeds.   - Start Atrovent (ipratropium) 1-2 puffs in each nostril up  to three times a day as needed for sneezing, post nasal drip, runny nose. Point nozzle outwards to help prevent nosebleeds.  - Can add nasal saline spray as needed to help reduce drying of nasal passages. Best to use before prescription nasal spray.   - Implement aeroallergen avoidance measures as discussed. Provided in AVS.       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 No history of recent trauma, surgery. No epistaxis. Discussed with patient that the nasal septum perforation is most likely due to her septoplasty. However, it is best for her to follow up with her ENT specialist as this may not be a new finding. Asymptomatic at this time. Counseled on appropriate use of nasal spray.           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    2023 - mild white coat syndrome, SBP 110-130 at home, most 110-120)      Multiple allergies 03/26/2011    Acid reflux 03/26/2011         Review of Systems   Constitutional: Negative.   HENT:  Positive for tinnitus.    Eyes: Negative.    Cardiovascular:  Positive for dyspnea on exertion, irregular heartbeat and palpitations.   Respiratory: Negative.     Endocrine: Negative.    Hematologic/Lymphatic: Negative.    Skin: Negative.    Musculoskeletal: Negative.    Gastrointestinal: Negative.    Genitourinary: Negative.    Neurological:  Positive for headaches.   Psychiatric/Behavioral: Negative.     Allergic/Immunologic: Negative.        Physical Exam    Physical Exam   General Appearance: no distress   Skin: warm, no ulcers or xanthomas   Digits and Nails: no cyanosis or clubbing   Eyes: conjunctivae and lids normal, pupils are equal and round   Teeth/Gums/Palate: dentition unremarkable, no lesions   Lips & Oral Mucosa: no pallor or cyanosis   Neck Veins: normal JVP , neck veins are not distended   Thyroid: no nodules, masses, tenderness or enlargement   Chest Inspection: chest is normal in appearance   Respiratory Effort: breathing comfortably, no respiratory distress   Auscultation/Percussion: lungs clear to auscultation, no rales or rhonchi, no wheezing   PMI: PMI not enlarged or displaced   Cardiac Rhythm: regular rhythm and normal rate   Cardiac Auscultation: S1, S2 normal, no rub, no gallop   Murmurs: no murmur   Peripheral Circulation: normal peripheral circulation   Carotid Arteries: normal carotid upstroke bilaterally, no bruits   Radial Arteries: normal symmetric radial pulses   Abdominal Aorta: no abdominal aortic bruit   Pedal Pulses: normal symmetric pedal pulses   Lower Extremity Edema: no lower extremity edema   Abdominal Exam: soft, non-tender, no masses, bowel sounds normal   Liver & Spleen: no organomegaly   Gait & Station: walks  without assistance   Muscle Strength: normal muscle tone   Orientation: oriented to time, place and person   Affect & Mood: appropriate and sustained affect   Language and Memory: patient responsive and seems to comprehend information   Neurologic Exam: neurological assessment grossly intact   Other: moves all extremities      Cardiovascular Health Factors  Vitals BP Readings from Last 3 Encounters:   01/25/22 (!) 138/90   01/01/22 (!) 153/85   12/18/21 126/73     Wt Readings from Last 3 Encounters:   01/25/22 94 kg (207 lb 3.2 oz)   01/01/22 90.7 kg (200 lb)   12/18/21 90.7 kg (200 lb)     BMI Readings from Last 3 Encounters:   01/25/22 30.60 kg/m?   01/01/22 30.41 kg/m?   12/18/21 29.53 kg/m?      Smoking Social History     Tobacco Use   Smoking Status Never    Passive exposure: Past   Smokeless Tobacco Never      Lipid Profile Cholesterol   Date Value Ref Range Status   10/11/2021 187 <200 MG/DL Final     HDL   Date Value Ref Range Status   10/11/2021 79 >40 MG/DL Final     LDL   Date Value Ref Range Status   10/11/2021 89 <100 mg/dL Final     Triglycerides   Date Value Ref Range Status   10/11/2021 56 <150 MG/DL Final      Blood Sugar No results found for: HGBA1C  Glucose   Date Value Ref Range Status 10/11/2021 82 70 - 100 MG/DL Final   09/60/4540 94 70 - 100 MG/DL Final   98/12/9145 93  Final          Problems Addressed Today  Encounter Diagnoses   Name Primary?    Paroxysmal atrial fibrillation (HCC) Yes    Mixed hyperlipidemia     Primary hypertension        Assessment and Plan       HTN (hypertension)  Her home blood pressure averages less than 130/80.    Hyperlipidemia  Lab Results   Component Value Date    CHOL 187 10/11/2021    TRIG 56 10/11/2021    HDL 79 10/11/2021    LDL 89 10/11/2021    VLDL 11 10/11/2021    NONHDLCHOL 108 10/11/2021    CHOLHDLC 2 09/16/2018      It is a fair question ask to the about her LDL target.  We really have never documented atherosclerotic disease in her situation and we talked about the possibility of a coronary calcium scan.  She would like to go ahead with the scan and if positive it would mean that we would want to target LDL less than 70.    Paroxysmal atrial fibrillation (HCC)  We talked a little bit about the possibility of a Kardia monitor but I just do not think the brief episodes of palpitation bother her enough to mess with it.  I did not recommend any changes in her medical program today.      Current Medications (including today's revisions)   ALPRAZolam (XANAX) 0.25 mg tablet Take one tablet by mouth at bedtime as needed.    aspirin 81 mg chewable tablet Chew one tablet by mouth daily.      COQ10 (UBIQUINOL) PO Take 400 mg by mouth daily.    erenumab-aooe (AIMOVIG) 140 mg/mL injection Inject 1 mL under the skin every 28 days.  FLAXSEED OIL PO Take 1,200 mg by mouth daily.      glucosamine HCl/chondroitin su (GLUCOSAMINE-CHONDROITIN PO) Take 2 tablets by mouth daily.    ipratropium bromide (ATROVENT) 42 mcg (0.06 %) nasal spray Apply two sprays to each nostril as directed three times daily as needed. Indications: chronic stuffy and runny nose not caused by allergies, runny nose    Lactobacillus acidophilus (PROBIOTIC PO) Take 1 capsule by mouth daily. levothyroxine (SYNTHROID) 25 mcg tablet TAKE 1 TABLET BY MOUTH DAILY    magnesium citrate 125 mg cap Take 1 capsule by mouth twice daily.    multivitamin/iron/folic acid (CENTRUM WOMEN PO) Take 1 tablet by mouth daily.    nortriptyline (PAMELOR) 75 mg capsule Take one capsule by mouth at bedtime daily.    omeprazole DR (PRILOSEC) 20 mg capsule Take one capsule by mouth twice daily.    OYSTER SHELL CALCIUM-VIT D3 500 mg-5 mcg (200 unit) tablet Take one tablet by mouth twice daily.    polypodium leucoto/niacinamide (HELIOCARE ADVANCED PO) Take 2 capsules by mouth daily.    propranolol LA (INDERAL LA) 60 mg capsule TAKE 1 CAPSULE BY MOUTH TWICE A DAY    rosuvastatin (CRESTOR) 20 mg tablet TAKE ONE TABLET BY MOUTH TWICE WEEKLY.    traZODone (DESYREL) 50 mg tablet Take three tablets by mouth at bedtime daily.    triamcinolone (NASACORT) 55 mcg nasal inhaler Apply one spray to each nostril as directed twice daily.     Total time spent on today's office visit was 30 minutes.  This includes face-to-face in person visit with patient as well as nonface-to-face time including review of the EMR, outside records, labs, radiologic studies, echocardiogram & other cardiovascular studies, formation of treatment plan, after visit summary, future disposition, and lastly on documentation.

## 2022-01-25 NOTE — Assessment & Plan Note
Lab Results   Component Value Date    CHOL 187 10/11/2021    TRIG 56 10/11/2021    HDL 79 10/11/2021    LDL 89 10/11/2021    VLDL 11 10/11/2021    NONHDLCHOL 108 10/11/2021    CHOLHDLC 2 09/16/2018      It is a fair question ask to the about her LDL target.  We really have never documented atherosclerotic disease in her situation and we talked about the possibility of a coronary calcium scan.  She would like to go ahead with the scan and if positive it would mean that we would want to target LDL less than 70.

## 2022-01-25 NOTE — Assessment & Plan Note
Her home blood pressure averages less than 130/80.

## 2022-01-25 NOTE — Assessment & Plan Note
We talked a little bit about the possibility of a Kardia monitor but I just do not think the brief episodes of palpitation bother her enough to mess with it.  I did not recommend any changes in her medical program today.

## 2022-01-26 ENCOUNTER — Encounter: Admit: 2022-01-26 | Discharge: 2022-01-26 | Payer: MEDICARE | Primary: Geriatric Medicine

## 2022-01-26 ENCOUNTER — Ambulatory Visit: Admit: 2022-01-26 | Discharge: 2022-01-26 | Payer: MEDICARE | Primary: Geriatric Medicine

## 2022-01-26 DIAGNOSIS — J329 Chronic sinusitis, unspecified: Secondary | ICD-10-CM

## 2022-01-26 DIAGNOSIS — R002 Palpitations: Secondary | ICD-10-CM

## 2022-01-26 DIAGNOSIS — H269 Unspecified cataract: Secondary | ICD-10-CM

## 2022-01-26 DIAGNOSIS — E039 Hypothyroidism, unspecified: Secondary | ICD-10-CM

## 2022-01-26 DIAGNOSIS — R0600 Dyspnea, unspecified: Secondary | ICD-10-CM

## 2022-01-26 DIAGNOSIS — B999 Unspecified infectious disease: Secondary | ICD-10-CM

## 2022-01-26 DIAGNOSIS — K219 Gastro-esophageal reflux disease without esophagitis: Secondary | ICD-10-CM

## 2022-01-26 DIAGNOSIS — M549 Dorsalgia, unspecified: Secondary | ICD-10-CM

## 2022-01-26 DIAGNOSIS — G43109 Migraine with aura, not intractable, without status migrainosus: Secondary | ICD-10-CM

## 2022-01-26 DIAGNOSIS — D72819 Decreased white blood cell count, unspecified: Secondary | ICD-10-CM

## 2022-01-26 DIAGNOSIS — R430 Anosmia: Secondary | ICD-10-CM

## 2022-01-26 DIAGNOSIS — I10 Essential (primary) hypertension: Secondary | ICD-10-CM

## 2022-01-26 DIAGNOSIS — F419 Anxiety disorder, unspecified: Secondary | ICD-10-CM

## 2022-01-26 DIAGNOSIS — T7840XA Allergy, unspecified, initial encounter: Secondary | ICD-10-CM

## 2022-01-26 DIAGNOSIS — H547 Unspecified visual loss: Secondary | ICD-10-CM

## 2022-01-26 DIAGNOSIS — R519 Chronic idiopathic facial pain: Secondary | ICD-10-CM

## 2022-01-26 DIAGNOSIS — Z780 Asymptomatic menopausal state: Secondary | ICD-10-CM

## 2022-01-26 DIAGNOSIS — E785 Hyperlipidemia, unspecified: Secondary | ICD-10-CM

## 2022-01-26 DIAGNOSIS — E0590 Thyrotoxicosis, unspecified without thyrotoxic crisis or storm: Secondary | ICD-10-CM

## 2022-01-26 DIAGNOSIS — I4891 Unspecified atrial fibrillation: Secondary | ICD-10-CM

## 2022-01-26 DIAGNOSIS — J301 Allergic rhinitis due to pollen: Secondary | ICD-10-CM

## 2022-01-26 DIAGNOSIS — I499 Cardiac arrhythmia, unspecified: Secondary | ICD-10-CM

## 2022-01-26 MED ORDER — TOPIRAMATE 25 MG PO TAB
ORAL_TABLET | 6 refills | Status: AC
Start: 2022-01-26 — End: ?

## 2022-01-26 NOTE — Progress Notes
Date of Service: 01/26/2022    Subjective:             Jasmine Lynch is a 66 y.o. female here for evaluation of headaches.     History of Present Illness  Jasmine Lynch is a 66 y.o. female with a past medical history of atrial fibrillation, chronic sinusitis, HTN, hyperlipidemia and hypothyroidism, referred to our clinic due to chronic headaches. Denies head trauma preceding symptoms. Patient describes chronic headaches for the past 10 years, they are localized on bifrontal area, pain described as pressure sensation, headaches are constant, they are not associated with nausea, dizziness, denies phonophobia or photophobia; headaches are not associated with aura or visual/autonomic symptoms, but she reported headaches long time ago were associated with menstrual cycles. Headaches are worse during afternoon/evenings, denies positional changes triggering them and they are not more prominent with cough. Other triggers can include weather changes as well as stress. Patient may wake up in the middle of night with headaches occasionally, and they may interfere with activities of daily living. Frequency has been 7 episodes a week (constant) and severity is 3-10/10. No recent ED visits reported and patient thinks they are overall worse lately (since COVID infection a few months ago). She denies dizzy spells, balance issues or history of falls. Patient also reports chronic non pulsating tinnitus. She was seen by ENT in the past and diagnosed with sinusitis, but prior antibiotics did not help with headaches. There is family history of migraines (sister).     Headache medications:  Preventive:   Aimovig 140mg  qmonth (taking for 2 years, denies clear benefit)  Propranolol 60mg  bid (denies benefit for headaches)  Nortriptyline 75mg  qday (reports some benefit for sleep and headaches are not as intense and denies side effects)   Tried Gabapentin (denies benefit).    Abortive:   Tylenol/Ibuprofen with some benefit (taking a few tablets per week), denies taking nausea meds  She denies taking any other over-the-counter medication recently.   Patient has been scheduled for neck block due to sinus problems.     Review of records:  CT sinuses was unremarkable per patient.        Review of Systems   Constitutional:  Positive for chills and fatigue.   HENT:  Positive for postnasal drip, sinus pressure and tinnitus.    Eyes:  Positive for visual disturbance.   Gastrointestinal:  Positive for constipation.   Endocrine: Positive for cold intolerance.   Musculoskeletal:  Positive for back pain.   Allergic/Immunologic: Positive for environmental allergies.   Neurological:  Positive for headaches.   All other systems reviewed and are negative.    Twelve-point review of systems was done in detail. Patient has noticed recent mood changes but denies history of depression or anxiety. The patient denies history of snoring but occasional drowsiness during the daytime and insomnia in the past. The patient denies numbness or tingling. She denies any history of kidney stones or glaucoma in the past. Denies hx of strokes or CAD.       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    Surgical History:   Procedure Laterality Date    HX OOPHORECTOMY Bilateral 1993    CARDIOVASCULAR STRESS TEST      COLONOSCOPY  2009?    DOPPLER ECHOCARDIOGRAPHY      ELECTROCARDIOGRAM      EVENT MONITOR      HX HYSTERECTOMY  1993    HX TONSILLECTOMY  1960    SINUS SURGERY  2008?       Social History     Socioeconomic History    Marital status: Married   Tobacco Use    Smoking status: Never     Passive exposure: Past    Smokeless tobacco: Never   Substance and Sexual Activity    Alcohol use: Not Currently     Comment: very rarely    Drug use: Never    Sexual activity: Not Currently     Partners: Male     Birth control/protection: None       Family History   Problem Relation Age of Onset    Alzheimer's Mother     Dementia Mother     Hypertension Father     Kidney Disease Father     Back pain Father     Stroke Brother     Stroke Paternal Aunt      ALLERGIES  No Known Allergies     Objective:          ALPRAZolam (XANAX) 0.25 mg tablet Take one tablet by mouth at bedtime as needed.    aspirin 81 mg chewable tablet Chew one tablet by mouth daily.      COQ10 (UBIQUINOL) PO Take 400 mg by mouth daily.    erenumab-aooe (AIMOVIG) 140 mg/mL injection Inject 1 mL under the skin every 28 days.    FLAXSEED OIL PO Take 1,200 mg by mouth daily.      glucosamine HCl/chondroitin su (GLUCOSAMINE-CHONDROITIN PO) Take 2 tablets by mouth daily.    ipratropium bromide (ATROVENT) 42 mcg (0.06 %) nasal spray Apply two sprays to each nostril as directed three times daily as needed. Indications: chronic stuffy and runny nose not caused by allergies, runny nose    Lactobacillus acidophilus (PROBIOTIC PO) Take 1 capsule by mouth daily.    levothyroxine (SYNTHROID) 25 mcg tablet TAKE 1 TABLET BY MOUTH DAILY    magnesium citrate 125 mg cap Take 1 capsule by mouth twice daily. multivitamin/iron/folic acid (CENTRUM WOMEN PO) Take 1 tablet by mouth daily.    nortriptyline (PAMELOR) 75 mg capsule Take one capsule by mouth at bedtime daily.    omeprazole DR (PRILOSEC) 20 mg capsule Take one capsule by mouth twice daily.  OYSTER SHELL CALCIUM-VIT D3 500 mg-5 mcg (200 unit) tablet Take one tablet by mouth twice daily.    polypodium leucoto/niacinamide (HELIOCARE ADVANCED PO) Take 2 capsules by mouth daily.    propranolol LA (INDERAL LA) 60 mg capsule TAKE 1 CAPSULE BY MOUTH TWICE A DAY    rosuvastatin (CRESTOR) 20 mg tablet TAKE ONE TABLET BY MOUTH TWICE WEEKLY.    traZODone (DESYREL) 50 mg tablet Take three tablets by mouth at bedtime daily.    triamcinolone (NASACORT) 55 mcg nasal inhaler Apply one spray to each nostril as directed twice daily.     Vitals:    01/26/22 0841   BP: 133/79   BP Source: Arm, Right Upper   Pulse: 70   Temp: 36.4 ?C (97.6 ?F)   TempSrc: Temporal   PainSc: Three   Weight: 95.3 kg (210 lb)   Height: 175.3 cm (5' 9.02)     Body mass index is 31 kg/m?Marland Kitchen     Physical Exam  GENERAL APPEARANCE: The patient is alert. Patient is in no acute distress, following commands and cooperative. Well developed and well nourished.   HEENT: Normocephalic and atraumatic. Mild tenderness to palpation over sinuses (left maxillary), no tenderness over temporal/occipital area.  EXTREMITIES: no leg swelling.    NEUROLOGIC EXAM:   Orientation: The patient is alert and oriented times three. Patient is following commands. Speech is fluent, intact comprehension, repetition and naming.     Cranial nerves:  1st cranial nerve:  not tested   2nd cranial nerve: normal; Visual fields are full to confrontation. Pupils are equal, round, and reactive to light and accommodation. Non mydriatic funduscopic exam with no papilledema.  3rd, 4th & 6th cranial nerves: Extraocular movements are intact without nystagmus.  5th cranial nerve: normal; intact muscles of mastication. Intact light touch but decreased to temperature on the left.  7th cranial nerve: normal; no facial asymmetry  8th cranial nerve: normal  9th & 10th cranial nerves: normal; Gag is present   11th cranial nerve: normal; Shoulder shrug symmetric   12th cranial nerve: normal; tongue is midline.      Strength: (Right/Left) Deltoid 5/5, Biceps 5/5, Triceps 5/5, Finger ext 5/5, interossei 5/5, Hip Flexion 5/5, Knee ext 5/5, Knee flex 5/5, Ankle dorsiflexion 5/5, Ankle plantarflexion 5/5. Normal bulk and tone in all four limbs without any evidence of an arm drift.  No abnormal movements during exam.  Sensory: Intact to pain, temperature and vibration in both upper/lower extremities.   Coordination is intact finger-to-nose, fine finger movements and rapidly alternating movements.    Deep tendon reflexes are 2+ in biceps, triceps, brachioradialis and patellar bilaterally and 1+ ankle reflex.  Hoffman sign is absent bilaterally. Clonus was not elicited.   Gait is normal based without ataxia. Fair tandem walking.    LABS:  Sodium   Date Value Ref Range Status   10/11/2021 140 137 - 147 MMOL/L Final     Potassium   Date Value Ref Range Status   10/11/2021 4.2 3.5 - 5.1 MMOL/L Final     Chloride   Date Value Ref Range Status   10/11/2021 104 98 - 110 MMOL/L Final     CO2   Date Value Ref Range Status   10/11/2021 26 21 - 30 MMOL/L Final     Anion Gap   Date Value Ref Range Status   10/11/2021 10 3 - 12 Final     Blood Urea Nitrogen   Date Value Ref Range Status   10/11/2021  27 (H) 7 - 25 MG/DL Final     Creatinine   Date Value Ref Range Status   10/11/2021 1.02 (H) 0.4 - 1.00 MG/DL Final     Glucose   Date Value Ref Range Status   10/11/2021 82 70 - 100 MG/DL Final     Calcium   Date Value Ref Range Status   10/11/2021 10.0 8.5 - 10.6 MG/DL Final     AST (SGOT)   Date Value Ref Range Status   10/11/2021 23 7 - 40 U/L Final     ALT (SGPT)   Date Value Ref Range Status   10/11/2021 20 7 - 56 U/L Final     Alk Phosphatase   Date Value Ref Range Status   10/11/2021 55 25 - 110 U/L Final     Albumin   Date Value Ref Range Status   10/11/2021 4.0 3.5 - 5.0 G/DL Final     Total Bilirubin   Date Value Ref Range Status   10/11/2021 0.4 0.3 - 1.2 MG/DL Final     Cholesterol   Date Value Ref Range Status   10/11/2021 187 <200 MG/DL Final     Triglycerides   Date Value Ref Range Status   10/11/2021 56 <150 MG/DL Final     HDL   Date Value Ref Range Status   10/11/2021 79 >40 MG/DL Final     LDL   Date Value Ref Range Status   10/11/2021 89 <100 mg/dL Final     VLDL   Date Value Ref Range Status   10/11/2021 11 MG/DL Final     TSH   Date Value Ref Range Status   10/11/2021 2.19 0.35 - 5.00 MCU/ML Final     Assessment and Plan:  Chronic headaches, probably multifactorial (sinus issues, recent COVID infection and tension type headaches may play a role). Patient with chronic history of frontal headaches with no clear features of migraines. Frequency has been worse lately (constant, since COVID infection a few months ago) and she denies benefit from Aimovig injections, but nortriptyline seems helping with severity and patient reports good tolerance. She denies dizziness, nausea, paresthesias, balance problems or recent falls, but also reports chronic non pulsatile tinnitus. Previous ENT evaluation with diagnosis of sinusitis but patient denies improvement with antibiotic use. Neurological exam shows some sensory loss to temperature (left face) and mild tenderness to palpation over left maxillary sinus, but no other focal signs, non mydriatic funduscopic exam with no papilledema. We will order MRI brain due to worsening headaches and exam findings. We will also start low dose Topiramate to try to optimize headache control. Other diagnosis, including sleep problems and sinus issues may also contribute to headaches, we advised patient to follow up with pain clinic since she may benefit from injections.     Recommendations:  We will start low dose Topiramate for headache prophylaxis (initial goal up to 50mg  bid if needed). The patient was advised about common side effects with this medication use. We may titrate this medication up in the next few weeks/months if needed.   We will also use a limited amount of NSAIDs and Benadryl as abortive medications for headaches.   MRI brain w/o contrast to exclude intracranial abnormalities.   We advised patient to follow up with pain clinic.   Follow-up appointment in 4-6 months.     Total time was 50 minutes. This time was spent preparing to see the patient, obtaining and/or reviewing history, performing an examination, ordering medications,  tests/ procedures, communicating results to the patient, documenting clinical information in the electronic health record and counseling/educating the patient regarding headaches.

## 2022-02-07 ENCOUNTER — Ambulatory Visit: Admit: 2022-02-07 | Discharge: 2022-02-07 | Payer: MEDICARE | Primary: Geriatric Medicine

## 2022-02-07 DIAGNOSIS — Z1211 Encounter for screening for malignant neoplasm of colon: Secondary | ICD-10-CM

## 2022-02-14 ENCOUNTER — Encounter: Admit: 2022-02-14 | Discharge: 2022-02-14 | Payer: MEDICARE | Primary: Geriatric Medicine

## 2022-02-21 ENCOUNTER — Encounter: Admit: 2022-02-21 | Discharge: 2022-02-21 | Payer: MEDICARE | Primary: Geriatric Medicine

## 2022-02-21 ENCOUNTER — Ambulatory Visit: Admit: 2022-02-21 | Discharge: 2022-02-21 | Payer: MEDICARE | Primary: Geriatric Medicine

## 2022-02-21 DIAGNOSIS — I480 Paroxysmal atrial fibrillation: Secondary | ICD-10-CM

## 2022-02-21 DIAGNOSIS — I10 Essential (primary) hypertension: Secondary | ICD-10-CM

## 2022-02-21 DIAGNOSIS — E782 Mixed hyperlipidemia: Secondary | ICD-10-CM

## 2022-02-21 NOTE — Patient Education
Preliminary Report  CardioScore- Coronary Calcium Score      Name: Jasmine Lynch  Date of birth: 13-Mar-1955  Age: 67 y.o.  Date: 02/21/2022   Gender: female Referring Provider: Dr. Barry Dienes      The following information is based on an analysis of the coronary (heart) arteries only. A coronary calcium score was computed based on the size and density of calcium deposits in each artery. Coronary calcium deposits do not correspond directly to the percentage of narrowing of the coronary arteries but are markers for underlying coronary atherosclerosis (narrowing of coronary arteries due to plaque). The higher your score, the higher your risk of future cardiac events. These calcium deposits may begin to form years before any symptoms develop. Early detection and risk factor modification such as smoking, and cholesterol intake, can slow the progress of heart disease.    Coronary Artery Calcium Score   (LM) Left Main 0   (LAD) Left Anterior Descending 1   (LCX) Left Circumflex 0   (RCA) Right Coronary Artery 0   (PDA) Posterior Descending Artery 0   Total 1     Your score indicates minimal identifiable calcification.     0  No identifiable calcification  1-10  Minimal identifiable calcification  11-100  Mild calcification  101-400 Moderate calcification  401+  Significant calcification      DEMOGRAPHIC COMPARISON    Your calcium score is 1, which places you in the 30th percentile for subjects of the same age and gender.     Your percentile rank using the MESA* reference value is 50%. The MESA reference value compares your calcium score to others with the same age (45-84), gender, and race/ethnicity.   *Multi-Ethnic Study of Atherosclerosis - www.mesa-nhlbi.org       ADDITIONAL COMMENTS    This scan is an evaluation of coronary artery calcification and is not intended for any other purpose.     This is a preliminary report. If additional information is noted on your final report, you will be contacted. RECOMMENDATIONS    _X_ Risk Factor Reduction  Diet: Eat a balanced meal that consists of a lean protein, vegetables, healthy fat and nutrient dense carbohydrates. Limit saturated and trans fat, red meat, highly processed foods, sweets, and sugar-sweetened beverages. Choose options that include:  A variety of fruits and vegetables-choose 2 colors at each meal  Lean proteins  Low-fat dairy products  Nuts and legumes  Heart healthy fats (Extra virgin olive oil and avocado)    Exercise: Get 150 minutes of physical activity per week. Physical activity should increase your heart rate, strengthen major muscle groups and increase flexibility. Simple ways to add more activity to your day include walking the dog, taking a family walk, parking farther away, and taking the stairs.    Keep cholesterol, blood pressure, blood sugar and weight within normal limits.    Manage stress and sleep hygiene.    Avoid tobacco products and excess alcohol consumption.    __ Risk factor reduction + follow up with your healthcare provider or Cardiologist. Further testing may be considered.     __ Risk factor reduction + follow up with a Cardiologist. Further testing is recommended.    _X_ Repeat CardioScore *Consult with your healthcare provider before repeating  _X_ 3-5 years  __ 5 years    __ Handouts provided    Should you ever experience chest pain, difficulty in breathing, discomfort radiating into your neck or arm, or discomfort combined with lightheadedness, sweating,  fainting or nausea, you should seek prompt medical attention.       Reviewed by: Daivd Council    CardioScore Educator/Exercise Specialist  Certified Health and Well-being Coach  [facility]  Department of Cardiovascular Medicine  [address]  [place], [place] [address]  Phone: [phone]  Email: [email or web address]

## 2022-02-22 ENCOUNTER — Encounter: Admit: 2022-02-22 | Discharge: 2022-02-22 | Payer: MEDICARE | Primary: Geriatric Medicine

## 2022-02-23 ENCOUNTER — Encounter: Admit: 2022-02-23 | Discharge: 2022-02-23 | Payer: MEDICARE | Primary: Geriatric Medicine

## 2022-02-23 NOTE — Telephone Encounter
-----  Message from Michiel Cowboy, MD sent at 02/22/2022  1:58 PM CST -----  [facility] can you please let Jasmine Lynch know that her calcium score really looks great.  No change in medications.  Thanks.    Cc:  Dr. Ruthy Dick

## 2022-02-23 NOTE — Telephone Encounter
Results and recommendations called to patient.

## 2022-02-26 ENCOUNTER — Encounter: Admit: 2022-02-26 | Discharge: 2022-02-26 | Payer: MEDICARE | Primary: Geriatric Medicine

## 2022-02-26 NOTE — Telephone Encounter
-----  Message from Steven D Owens, MD sent at 02/22/2022  1:58 PM CST -----  [facility] can you please let Jasmine Lynch know that her calcium score really looks great.  No change in medications.  Thanks.    Cc:  Dr. Zwahlen

## 2022-02-26 NOTE — Telephone Encounter
Called and discussed results with patient.  No questions at this time.  Pt will callback with any questions, concerns or problems.

## 2022-03-29 ENCOUNTER — Encounter: Admit: 2022-03-29 | Discharge: 2022-03-29 | Payer: MEDICARE | Primary: Geriatric Medicine

## 2022-03-29 ENCOUNTER — Ambulatory Visit: Admit: 2022-03-29 | Discharge: 2022-03-29 | Payer: MEDICARE | Primary: Geriatric Medicine

## 2022-03-29 DIAGNOSIS — R519 Sinus headache: Secondary | ICD-10-CM

## 2022-04-02 ENCOUNTER — Encounter: Admit: 2022-04-02 | Discharge: 2022-04-02 | Payer: MEDICARE | Primary: Geriatric Medicine

## 2022-04-02 ENCOUNTER — Ambulatory Visit: Admit: 2022-04-02 | Discharge: 2022-04-03 | Payer: MEDICARE | Primary: Geriatric Medicine

## 2022-04-02 DIAGNOSIS — F419 Anxiety disorder, unspecified: Secondary | ICD-10-CM

## 2022-04-02 DIAGNOSIS — J301 Allergic rhinitis due to pollen: Secondary | ICD-10-CM

## 2022-04-02 DIAGNOSIS — H269 Unspecified cataract: Secondary | ICD-10-CM

## 2022-04-02 DIAGNOSIS — G43109 Migraine with aura, not intractable, without status migrainosus: Secondary | ICD-10-CM

## 2022-04-02 DIAGNOSIS — H547 Unspecified visual loss: Secondary | ICD-10-CM

## 2022-04-02 DIAGNOSIS — Z780 Asymptomatic menopausal state: Secondary | ICD-10-CM

## 2022-04-02 DIAGNOSIS — E0590 Thyrotoxicosis, unspecified without thyrotoxic crisis or storm: Secondary | ICD-10-CM

## 2022-04-02 DIAGNOSIS — R519 Chronic idiopathic facial pain: Secondary | ICD-10-CM

## 2022-04-02 DIAGNOSIS — M549 Dorsalgia, unspecified: Secondary | ICD-10-CM

## 2022-04-02 DIAGNOSIS — T7840XA Allergy, unspecified, initial encounter: Secondary | ICD-10-CM

## 2022-04-02 DIAGNOSIS — I10 Essential (primary) hypertension: Secondary | ICD-10-CM

## 2022-04-02 DIAGNOSIS — I499 Cardiac arrhythmia, unspecified: Secondary | ICD-10-CM

## 2022-04-02 DIAGNOSIS — R0600 Dyspnea, unspecified: Secondary | ICD-10-CM

## 2022-04-02 DIAGNOSIS — R002 Palpitations: Secondary | ICD-10-CM

## 2022-04-02 DIAGNOSIS — I4891 Unspecified atrial fibrillation: Secondary | ICD-10-CM

## 2022-04-02 DIAGNOSIS — R430 Anosmia: Secondary | ICD-10-CM

## 2022-04-02 DIAGNOSIS — K219 Gastro-esophageal reflux disease without esophagitis: Secondary | ICD-10-CM

## 2022-04-02 DIAGNOSIS — B999 Unspecified infectious disease: Secondary | ICD-10-CM

## 2022-04-02 DIAGNOSIS — E785 Hyperlipidemia, unspecified: Secondary | ICD-10-CM

## 2022-04-02 DIAGNOSIS — J329 Chronic sinusitis, unspecified: Secondary | ICD-10-CM

## 2022-04-02 DIAGNOSIS — D72819 Decreased white blood cell count, unspecified: Secondary | ICD-10-CM

## 2022-04-02 DIAGNOSIS — G500 Trigeminal neuralgia: Secondary | ICD-10-CM

## 2022-04-02 DIAGNOSIS — E039 Hypothyroidism, unspecified: Secondary | ICD-10-CM

## 2022-04-02 MED ORDER — BUPIVACAINE (PF) 0.5 % (5 MG/ML) IJ SOLN
1 mL | Freq: Once | INTRAMUSCULAR | 0 refills | Status: CP | PRN
Start: 2022-04-02 — End: ?

## 2022-04-02 MED ORDER — LIDOCAINE (PF) 20 MG/ML (2 %) IJ SOLN
1 mL | Freq: Once | INTRAMUSCULAR | 0 refills | Status: CP | PRN
Start: 2022-04-02 — End: ?

## 2022-04-02 NOTE — Procedures
Attending Surgeon: Evelina Bucy, MD    Anesthesia: Local    Pre-Procedure Diagnosis:   1. Trigeminal neuralgia    2. Facial pain    3. Neuropathic pain        Post-Procedure Diagnosis:   1. Trigeminal neuralgia    2. Facial pain    3. Neuropathic pain        [facility]  Nerve: Sphenopalantine ganglion  Laterality: bilateral   on 04/02/2022 11:15 AM    Consent:   Consent obtained: verbal  Consent given by: patient  Alternatives discussed: referral, no treatment, delayed treatment and alternative treatment  Discussed with patient the purpose of the treatment/procedure, other ways of treating my condition, including no treatment/ procedure and the risks and benefits of the alternatives. Patient has decided to proceed with treatment/procedure.        Universal Protocol:  Relevant documents: relevant documents present and verified  Site marked: the operative site was marked  Patient identity confirmed: Patient identify confirmed verbally with patient.        Time out: Immediately prior to procedure a time out was called to verify the correct patient, procedure, equipment, support staff and site/side marked as required        Procedures Details:   Indications: Neuritis and Pain Relief  Preparation: Patient was prepped and draped in the usual sterile fashion.  Prep: 2% chlorhexidine  Patient position: supine  Guidance: anatomical  Right Medication Administered - 1 mL lidocaine (PF) 20 mg/mL (2 %); 1 mL bupivacaine PF 0.5 %  Medication Administered - 1 mL lidocaine (PF) 20 mg/mL (2 %); 1 mL bupivacaine PF 0.5 %  Patient tolerance: tolerated well, no immediate complications  Comments:   PROCEDURE: bilateral Sphenopalatine Ganglion Block    SURGEON: Evelina Bucy, MD    MEDICATION: 1 mL of 0.5% bupivacaine each side    LOCAL ANESTHETIC: 2% lidocaine    SEDATION: None.    ESTIMATED BLOOD LOSS: None.    SPECIMENS REMOVED: None    COMPLICATIONS: None.    TECHNIQUE: A time-out was taken to identify the correct patient, procedure, and site prior to starting the procedure. Lying in a supine position with the back slightly elevated and the head in a sniffing position, an 18ga-IV catheter with the needle removed was used to spray 2% lidocaine into the nasal passage bilaterally with a total of 1.5 mL per side. A sterile cotton-tipped applicator with an 18ga IV catheter inserted in the distal hollow tip, was soaked in 0.5% bupivacaine and then gently inserted into the nares and advanced along the superior border of the middle turbinate until it came to rest against the posterior nasopharynx. A second applicator was placed in similar fashion on the other side.    The applicators were left in place for 20 minutes. A syringe with 0.5% bupivacaine was then attached to the IV catheter inserted into the tip of each applicator and 0.82mL of 0.5% bupivacaine was injected.    The applicators were then removed after another 10 minutes had elapsed.     The procedure was completed without complications. The patient was discharged in stable condition.       Estimated blood loss: none or minimal  Specimens: none  Patient tolerated the procedure well with no immediate complications. Pressure was applied, and hemostasis was accomplished.

## 2022-04-02 NOTE — Patient Instructions
General Instructions:  How to reach me: Please send a MyChart message to the [facility] or leave a voicemail for my nurse Rebecca at [phone].  Scheduling: Our scheduling phone number is [phone].  How to get a medication refill: Five business days before refill needed, please use the MyChart Refill request or contact your pharmacy directly to request medication refills.   How to receive your test results: If you have signed up for MyChart, you will receive your test results and messages from me this way. Otherwise, you will get a phone call or letter. If you are expecting results and have not heard from my office within 2 weeks of your testing, please send a MyChart message or call my office.  Support for many chronic illnesses is available through [facility]: [facility] or [phone].  For questions on nights, weekends or holidays, call the operator at [phone], and ask for the doctor on call for Anesthesia Pain Management.

## 2022-04-03 DIAGNOSIS — M792 Neuralgia and neuritis, unspecified: Secondary | ICD-10-CM

## 2022-04-09 ENCOUNTER — Ambulatory Visit: Admit: 2022-04-09 | Discharge: 2022-04-10 | Payer: MEDICARE | Primary: Geriatric Medicine

## 2022-04-09 ENCOUNTER — Encounter: Admit: 2022-04-09 | Discharge: 2022-04-09 | Payer: MEDICARE | Primary: Geriatric Medicine

## 2022-04-09 DIAGNOSIS — E0590 Thyrotoxicosis, unspecified without thyrotoxic crisis or storm: Secondary | ICD-10-CM

## 2022-04-09 DIAGNOSIS — R519 Chronic idiopathic facial pain: Secondary | ICD-10-CM

## 2022-04-09 DIAGNOSIS — I10 Essential (primary) hypertension: Secondary | ICD-10-CM

## 2022-04-09 DIAGNOSIS — F419 Anxiety disorder, unspecified: Secondary | ICD-10-CM

## 2022-04-09 DIAGNOSIS — K219 Gastro-esophageal reflux disease without esophagitis: Secondary | ICD-10-CM

## 2022-04-09 DIAGNOSIS — G43109 Migraine with aura, not intractable, without status migrainosus: Secondary | ICD-10-CM

## 2022-04-09 DIAGNOSIS — Z780 Asymptomatic menopausal state: Secondary | ICD-10-CM

## 2022-04-09 DIAGNOSIS — M549 Dorsalgia, unspecified: Secondary | ICD-10-CM

## 2022-04-09 DIAGNOSIS — T7840XA Allergy, unspecified, initial encounter: Secondary | ICD-10-CM

## 2022-04-09 DIAGNOSIS — D72819 Decreased white blood cell count, unspecified: Secondary | ICD-10-CM

## 2022-04-09 DIAGNOSIS — R002 Palpitations: Secondary | ICD-10-CM

## 2022-04-09 DIAGNOSIS — E039 Hypothyroidism, unspecified: Secondary | ICD-10-CM

## 2022-04-09 DIAGNOSIS — I499 Cardiac arrhythmia, unspecified: Secondary | ICD-10-CM

## 2022-04-09 DIAGNOSIS — H269 Unspecified cataract: Secondary | ICD-10-CM

## 2022-04-09 DIAGNOSIS — J301 Allergic rhinitis due to pollen: Secondary | ICD-10-CM

## 2022-04-09 DIAGNOSIS — E785 Hyperlipidemia, unspecified: Secondary | ICD-10-CM

## 2022-04-09 DIAGNOSIS — R430 Anosmia: Secondary | ICD-10-CM

## 2022-04-09 DIAGNOSIS — B999 Unspecified infectious disease: Secondary | ICD-10-CM

## 2022-04-09 DIAGNOSIS — H547 Unspecified visual loss: Secondary | ICD-10-CM

## 2022-04-09 DIAGNOSIS — R0600 Dyspnea, unspecified: Secondary | ICD-10-CM

## 2022-04-09 DIAGNOSIS — J329 Chronic sinusitis, unspecified: Secondary | ICD-10-CM

## 2022-04-09 DIAGNOSIS — I4891 Unspecified atrial fibrillation: Secondary | ICD-10-CM

## 2022-04-12 ENCOUNTER — Encounter: Admit: 2022-04-12 | Discharge: 2022-04-12 | Payer: MEDICARE | Primary: Geriatric Medicine

## 2022-04-12 DIAGNOSIS — H547 Unspecified visual loss: Secondary | ICD-10-CM

## 2022-04-12 DIAGNOSIS — J301 Allergic rhinitis due to pollen: Secondary | ICD-10-CM

## 2022-04-12 DIAGNOSIS — Z780 Asymptomatic menopausal state: Secondary | ICD-10-CM

## 2022-04-12 DIAGNOSIS — R519 Chronic idiopathic facial pain: Secondary | ICD-10-CM

## 2022-04-12 DIAGNOSIS — I499 Cardiac arrhythmia, unspecified: Secondary | ICD-10-CM

## 2022-04-12 DIAGNOSIS — E0590 Thyrotoxicosis, unspecified without thyrotoxic crisis or storm: Secondary | ICD-10-CM

## 2022-04-12 DIAGNOSIS — D72819 Decreased white blood cell count, unspecified: Secondary | ICD-10-CM

## 2022-04-12 DIAGNOSIS — E785 Hyperlipidemia, unspecified: Secondary | ICD-10-CM

## 2022-04-12 DIAGNOSIS — H269 Unspecified cataract: Secondary | ICD-10-CM

## 2022-04-12 DIAGNOSIS — F419 Anxiety disorder, unspecified: Secondary | ICD-10-CM

## 2022-04-12 DIAGNOSIS — B999 Unspecified infectious disease: Secondary | ICD-10-CM

## 2022-04-12 DIAGNOSIS — I4891 Unspecified atrial fibrillation: Secondary | ICD-10-CM

## 2022-04-12 DIAGNOSIS — G43109 Migraine with aura, not intractable, without status migrainosus: Secondary | ICD-10-CM

## 2022-04-12 DIAGNOSIS — E039 Hypothyroidism, unspecified: Secondary | ICD-10-CM

## 2022-04-12 DIAGNOSIS — R0600 Dyspnea, unspecified: Secondary | ICD-10-CM

## 2022-04-12 DIAGNOSIS — J329 Chronic sinusitis, unspecified: Secondary | ICD-10-CM

## 2022-04-12 DIAGNOSIS — R430 Anosmia: Secondary | ICD-10-CM

## 2022-04-12 DIAGNOSIS — T7840XA Allergy, unspecified, initial encounter: Secondary | ICD-10-CM

## 2022-04-12 DIAGNOSIS — R002 Palpitations: Secondary | ICD-10-CM

## 2022-04-12 DIAGNOSIS — M549 Dorsalgia, unspecified: Secondary | ICD-10-CM

## 2022-04-12 DIAGNOSIS — I10 Essential (primary) hypertension: Secondary | ICD-10-CM

## 2022-04-12 DIAGNOSIS — K219 Gastro-esophageal reflux disease without esophagitis: Secondary | ICD-10-CM

## 2022-04-18 ENCOUNTER — Encounter: Admit: 2022-04-18 | Discharge: 2022-04-18 | Payer: MEDICARE | Primary: Geriatric Medicine

## 2022-04-18 ENCOUNTER — Ambulatory Visit: Admit: 2022-04-18 | Discharge: 2022-04-18 | Payer: MEDICARE | Primary: Geriatric Medicine

## 2022-04-18 ENCOUNTER — Ambulatory Visit: Admit: 2022-04-18 | Discharge: 2022-04-19 | Payer: MEDICARE | Primary: Geriatric Medicine

## 2022-04-18 DIAGNOSIS — R430 Anosmia: Secondary | ICD-10-CM

## 2022-04-18 DIAGNOSIS — Z780 Asymptomatic menopausal state: Secondary | ICD-10-CM

## 2022-04-18 DIAGNOSIS — D72819 Decreased white blood cell count, unspecified: Secondary | ICD-10-CM

## 2022-04-18 DIAGNOSIS — E782 Mixed hyperlipidemia: Secondary | ICD-10-CM

## 2022-04-18 DIAGNOSIS — E559 Vitamin D deficiency, unspecified: Secondary | ICD-10-CM

## 2022-04-18 DIAGNOSIS — I10 Essential (primary) hypertension: Secondary | ICD-10-CM

## 2022-04-18 DIAGNOSIS — G43109 Migraine with aura, not intractable, without status migrainosus: Secondary | ICD-10-CM

## 2022-04-18 DIAGNOSIS — B999 Unspecified infectious disease: Secondary | ICD-10-CM

## 2022-04-18 DIAGNOSIS — H269 Unspecified cataract: Secondary | ICD-10-CM

## 2022-04-18 DIAGNOSIS — K219 Gastro-esophageal reflux disease without esophagitis: Secondary | ICD-10-CM

## 2022-04-18 DIAGNOSIS — I499 Cardiac arrhythmia, unspecified: Secondary | ICD-10-CM

## 2022-04-18 DIAGNOSIS — J329 Chronic sinusitis, unspecified: Secondary | ICD-10-CM

## 2022-04-18 DIAGNOSIS — T7840XA Allergy, unspecified, initial encounter: Secondary | ICD-10-CM

## 2022-04-18 DIAGNOSIS — M549 Dorsalgia, unspecified: Secondary | ICD-10-CM

## 2022-04-18 DIAGNOSIS — R519 Chronic idiopathic facial pain: Secondary | ICD-10-CM

## 2022-04-18 DIAGNOSIS — F419 Anxiety disorder, unspecified: Secondary | ICD-10-CM

## 2022-04-18 DIAGNOSIS — Z0000 Encounter for general adult medical examination without abnormal findings: Secondary | ICD-10-CM

## 2022-04-18 DIAGNOSIS — K5901 Slow transit constipation: Secondary | ICD-10-CM

## 2022-04-18 DIAGNOSIS — R002 Palpitations: Secondary | ICD-10-CM

## 2022-04-18 DIAGNOSIS — R0600 Dyspnea, unspecified: Secondary | ICD-10-CM

## 2022-04-18 DIAGNOSIS — E039 Hypothyroidism, unspecified: Secondary | ICD-10-CM

## 2022-04-18 DIAGNOSIS — E785 Hyperlipidemia, unspecified: Secondary | ICD-10-CM

## 2022-04-18 DIAGNOSIS — E0590 Thyrotoxicosis, unspecified without thyrotoxic crisis or storm: Secondary | ICD-10-CM

## 2022-04-18 DIAGNOSIS — I480 Paroxysmal atrial fibrillation: Secondary | ICD-10-CM

## 2022-04-18 DIAGNOSIS — M159 Polyosteoarthritis, unspecified: Secondary | ICD-10-CM

## 2022-04-18 DIAGNOSIS — I4891 Unspecified atrial fibrillation: Secondary | ICD-10-CM

## 2022-04-18 DIAGNOSIS — H547 Unspecified visual loss: Secondary | ICD-10-CM

## 2022-04-18 DIAGNOSIS — J301 Allergic rhinitis due to pollen: Secondary | ICD-10-CM

## 2022-04-18 DIAGNOSIS — J309 Allergic rhinitis, unspecified: Secondary | ICD-10-CM

## 2022-04-18 LAB — TSH WITH FREE T4 REFLEX: TSH: 2.5 uU/mL (ref 0.35–5.00)

## 2022-04-18 LAB — COMPREHENSIVE METABOLIC PANEL
ALT: 21 U/L (ref 7–56)
ANION GAP: 10 (ref 3–12)
AST: 20 U/L (ref 7–40)
CO2: 26 MMOL/L (ref 21–30)
EGFR: 57 mL/min — ABNORMAL LOW (ref >60)
SODIUM: 139 MMOL/L (ref 137–147)

## 2022-04-18 LAB — CBC
HEMATOCRIT: 39 % (ref 36–45)
HEMOGLOBIN: 12 g/dL (ref 12.0–15.0)
MCH: 30 pg (ref 26–34)
MCHC: 32 g/dL (ref 32.0–36.0)
MCV: 93 FL — ABNORMAL HIGH (ref 80–100)
MPV: 8.8 FL (ref 7–11)
PLATELET COUNT: 266 K/UL (ref 150–400)
RBC COUNT: 4.2 M/UL (ref 4.0–5.0)
RDW: 14 % (ref 11–15)
WBC COUNT: 6.6 K/UL (ref 4.5–11.0)

## 2022-04-18 MED ORDER — CEPHALEXIN 500 MG PO CAP
500 mg | ORAL_CAPSULE | ORAL | 0 refills | Status: AC
Start: 2022-04-18 — End: ?

## 2022-04-18 MED ORDER — NORTRIPTYLINE 25 MG PO CAP
50 mg | ORAL_CAPSULE | Freq: Every evening | ORAL | 0 refills | Status: AC
Start: 2022-04-18 — End: ?

## 2022-04-18 MED ORDER — TRAZODONE 150 MG PO TAB
150 mg | ORAL_TABLET | Freq: Every evening | ORAL | 3 refills | Status: AC | PRN
Start: 2022-04-18 — End: ?

## 2022-04-19 ENCOUNTER — Encounter: Admit: 2022-04-19 | Discharge: 2022-04-19 | Payer: MEDICARE | Primary: Geriatric Medicine

## 2022-04-19 DIAGNOSIS — R399 Unspecified symptoms and signs involving the genitourinary system: Secondary | ICD-10-CM

## 2022-04-19 DIAGNOSIS — R82998 Other abnormal findings in urine: Secondary | ICD-10-CM

## 2022-04-19 DIAGNOSIS — E039 Hypothyroidism, unspecified: Secondary | ICD-10-CM

## 2022-04-20 ENCOUNTER — Encounter: Admit: 2022-04-20 | Discharge: 2022-04-20 | Payer: MEDICARE | Primary: Geriatric Medicine

## 2022-04-30 ENCOUNTER — Encounter: Admit: 2022-04-30 | Discharge: 2022-04-30 | Payer: MEDICARE | Primary: Geriatric Medicine

## 2022-04-30 DIAGNOSIS — Z1211 Encounter for screening for malignant neoplasm of colon: Secondary | ICD-10-CM

## 2022-05-02 ENCOUNTER — Encounter: Admit: 2022-05-02 | Discharge: 2022-05-02 | Payer: MEDICARE | Primary: Geriatric Medicine

## 2022-05-09 ENCOUNTER — Encounter: Admit: 2022-05-09 | Discharge: 2022-05-09 | Payer: MEDICARE | Primary: Geriatric Medicine

## 2022-05-11 ENCOUNTER — Encounter: Admit: 2022-05-11 | Discharge: 2022-05-11 | Payer: MEDICARE | Primary: Geriatric Medicine

## 2022-05-15 ENCOUNTER — Ambulatory Visit: Admit: 2022-05-15 | Discharge: 2022-05-15 | Payer: MEDICARE | Primary: Geriatric Medicine

## 2022-05-15 DIAGNOSIS — Z1211 Encounter for screening for malignant neoplasm of colon: Secondary | ICD-10-CM

## 2022-05-18 ENCOUNTER — Encounter: Admit: 2022-05-18 | Discharge: 2022-05-18 | Payer: MEDICARE | Primary: Geriatric Medicine

## 2022-06-08 ENCOUNTER — Encounter: Admit: 2022-06-08 | Discharge: 2022-06-08 | Payer: MEDICARE | Primary: Geriatric Medicine

## 2022-06-08 DIAGNOSIS — Z780 Asymptomatic menopausal state: Secondary | ICD-10-CM

## 2022-06-08 DIAGNOSIS — I4891 Unspecified atrial fibrillation: Secondary | ICD-10-CM

## 2022-06-08 DIAGNOSIS — F419 Anxiety disorder, unspecified: Secondary | ICD-10-CM

## 2022-06-08 DIAGNOSIS — J329 Chronic sinusitis, unspecified: Secondary | ICD-10-CM

## 2022-06-08 DIAGNOSIS — K219 Gastro-esophageal reflux disease without esophagitis: Secondary | ICD-10-CM

## 2022-06-08 DIAGNOSIS — R0600 Dyspnea, unspecified: Secondary | ICD-10-CM

## 2022-06-08 DIAGNOSIS — M549 Dorsalgia, unspecified: Secondary | ICD-10-CM

## 2022-06-08 DIAGNOSIS — E785 Hyperlipidemia, unspecified: Secondary | ICD-10-CM

## 2022-06-08 DIAGNOSIS — R430 Anosmia: Secondary | ICD-10-CM

## 2022-06-08 DIAGNOSIS — R519 Chronic idiopathic facial pain: Secondary | ICD-10-CM

## 2022-06-08 DIAGNOSIS — R002 Palpitations: Secondary | ICD-10-CM

## 2022-06-08 DIAGNOSIS — H547 Unspecified visual loss: Secondary | ICD-10-CM

## 2022-06-08 DIAGNOSIS — B999 Unspecified infectious disease: Secondary | ICD-10-CM

## 2022-06-08 DIAGNOSIS — E039 Hypothyroidism, unspecified: Secondary | ICD-10-CM

## 2022-06-08 DIAGNOSIS — I10 Essential (primary) hypertension: Secondary | ICD-10-CM

## 2022-06-08 DIAGNOSIS — J301 Allergic rhinitis due to pollen: Secondary | ICD-10-CM

## 2022-06-08 DIAGNOSIS — I499 Cardiac arrhythmia, unspecified: Secondary | ICD-10-CM

## 2022-06-08 DIAGNOSIS — T7840XA Allergy, unspecified, initial encounter: Secondary | ICD-10-CM

## 2022-06-08 DIAGNOSIS — G43109 Migraine with aura, not intractable, without status migrainosus: Secondary | ICD-10-CM

## 2022-06-08 DIAGNOSIS — K5901 Slow transit constipation: Secondary | ICD-10-CM

## 2022-06-08 DIAGNOSIS — D72819 Decreased white blood cell count, unspecified: Secondary | ICD-10-CM

## 2022-06-08 DIAGNOSIS — E0590 Thyrotoxicosis, unspecified without thyrotoxic crisis or storm: Secondary | ICD-10-CM

## 2022-06-08 DIAGNOSIS — H269 Unspecified cataract: Secondary | ICD-10-CM

## 2022-06-14 ENCOUNTER — Encounter: Admit: 2022-06-14 | Discharge: 2022-06-14 | Payer: MEDICARE | Primary: Geriatric Medicine

## 2022-06-14 MED ORDER — ROSUVASTATIN 20 MG PO TAB
20 mg | ORAL_TABLET | ORAL | 3 refills | 90.00000 days | Status: AC
Start: 2022-06-14 — End: ?

## 2022-06-18 ENCOUNTER — Encounter: Admit: 2022-06-18 | Discharge: 2022-06-18 | Payer: MEDICARE | Primary: Geriatric Medicine

## 2022-06-18 ENCOUNTER — Ambulatory Visit: Admit: 2022-06-18 | Discharge: 2022-06-19 | Payer: MEDICARE | Primary: Geriatric Medicine

## 2022-06-18 DIAGNOSIS — I10 Essential (primary) hypertension: Secondary | ICD-10-CM

## 2022-06-18 DIAGNOSIS — F419 Anxiety disorder, unspecified: Secondary | ICD-10-CM

## 2022-06-18 DIAGNOSIS — K219 Gastro-esophageal reflux disease without esophagitis: Secondary | ICD-10-CM

## 2022-06-18 DIAGNOSIS — K5901 Slow transit constipation: Secondary | ICD-10-CM

## 2022-06-18 DIAGNOSIS — D72819 Decreased white blood cell count, unspecified: Secondary | ICD-10-CM

## 2022-06-18 DIAGNOSIS — J329 Chronic sinusitis, unspecified: Secondary | ICD-10-CM

## 2022-06-18 DIAGNOSIS — M549 Dorsalgia, unspecified: Secondary | ICD-10-CM

## 2022-06-18 DIAGNOSIS — R002 Palpitations: Secondary | ICD-10-CM

## 2022-06-18 DIAGNOSIS — I4891 Unspecified atrial fibrillation: Secondary | ICD-10-CM

## 2022-06-18 DIAGNOSIS — Z780 Asymptomatic menopausal state: Secondary | ICD-10-CM

## 2022-06-18 DIAGNOSIS — E039 Hypothyroidism, unspecified: Secondary | ICD-10-CM

## 2022-06-18 DIAGNOSIS — R519 Chronic idiopathic facial pain: Secondary | ICD-10-CM

## 2022-06-18 DIAGNOSIS — H269 Unspecified cataract: Secondary | ICD-10-CM

## 2022-06-18 DIAGNOSIS — E0590 Thyrotoxicosis, unspecified without thyrotoxic crisis or storm: Secondary | ICD-10-CM

## 2022-06-18 DIAGNOSIS — T7840XA Allergy, unspecified, initial encounter: Secondary | ICD-10-CM

## 2022-06-18 DIAGNOSIS — H547 Unspecified visual loss: Secondary | ICD-10-CM

## 2022-06-18 DIAGNOSIS — E785 Hyperlipidemia, unspecified: Secondary | ICD-10-CM

## 2022-06-18 DIAGNOSIS — J301 Allergic rhinitis due to pollen: Secondary | ICD-10-CM

## 2022-06-18 DIAGNOSIS — R430 Anosmia: Secondary | ICD-10-CM

## 2022-06-18 DIAGNOSIS — B999 Unspecified infectious disease: Secondary | ICD-10-CM

## 2022-06-18 DIAGNOSIS — R0600 Dyspnea, unspecified: Secondary | ICD-10-CM

## 2022-06-18 DIAGNOSIS — I499 Cardiac arrhythmia, unspecified: Secondary | ICD-10-CM

## 2022-06-18 DIAGNOSIS — G43109 Migraine with aura, not intractable, without status migrainosus: Secondary | ICD-10-CM

## 2022-06-18 DIAGNOSIS — G44221 Chronic tension-type headache, intractable: Secondary | ICD-10-CM

## 2022-06-18 NOTE — Progress Notes
Date of Service: 06/18/2022    Subjective:             Jasmine Lynch is a 67 y.o. female here for follow up due to headaches.     History of Present Illness  Jasmine Lynch is a 67 y.o. female with a past medical history of atrial fibrillation, chronic sinusitis, HTN, hyperlipidemia and hypothyroidism, here for follow up due to chronic headaches. Patient describes headaches for years, they are usually localized on bilateral frontal area, pain described as pressure sensation, headaches were constant in the past (lasting a few hours lately), they are not associated with nausea, phonophobia or photophobia; denies aura or visual/autonomic symptoms with them. Menstrual cycles and COVID infection triggered headaches in the past and she also describes weather changes and stress as triggers, but denies new triggers lately. Frequency has been 1-2 episodes a week (for 1-2 hours) and severity could be up to 5-6/10. Headaches are overall significantly better lately. Patient denies recent falls, memory loss or new neurological symptoms. MRI brain showed small areas that could represent microhemorrhages as well as microvascular disease. Patient denies recent mood changes or sleep problems.      Headache medications:  Preventive:   Topiramate 50mg  bid (reports benefit, denies side effects)  Nortriptyline 75mg  qday (reports some benefit for sleep and headaches are not as intense and denies side effects)   Propranolol 60mg  bid (denies benefit for headaches)  Tried Gabapentin (denies benefit) and Aimovig 140mg  qmonth (denies clear benefit)     Abortive:   Tylenol/Ibuprofen with some benefit (not taking lately), denies taking nausea meds  She denies taking any other over-the-counter medication recently.         Review of Systems      ALLERGIES  No Known Allergies    Objective:          ALPRAZolam (XANAX) 0.25 mg tablet Take one tablet by mouth at bedtime as needed.    aspirin 81 mg chewable tablet Chew one tablet by mouth daily. azelastine (ASTELIN) 137 mcg (0.1 %) nasal spray Apply one spray to each nostril as directed at bedtime daily.    COQ10 (UBIQUINOL) PO Take 400 mg by mouth daily.    FLAXSEED OIL PO Take 1,200 mg by mouth daily.      glucosamine HCl/chondroitin su (GLUCOSAMINE-CHONDROITIN PO) Take 2 tablets by mouth daily.    ipratropium bromide (ATROVENT) 42 mcg (0.06 %) nasal spray Apply two sprays to each nostril as directed three times daily as needed. Indications: chronic stuffy and runny nose not caused by allergies, runny nose    Lactobacillus acidophilus (PROBIOTIC PO) Take 1 capsule by mouth daily.    levothyroxine (SYNTHROID) 25 mcg tablet TAKE 1 TABLET BY MOUTH DAILY    magnesium citrate 125 mg cap Take 1 capsule by mouth twice daily.    multivitamin/iron/folic acid (CENTRUM WOMEN PO) Take 1 tablet by mouth daily.    nortriptyline (PAMELOR) 75 mg capsule Take one capsule by mouth at bedtime daily.    omeprazole DR (PRILOSEC) 20 mg capsule Take one capsule by mouth twice daily.    pantoprazole DR (PROTONIX) 40 mg tablet Take one tablet by mouth daily.    peg-electrolyte solution (NULYTELY) 420 gram oral solution The day prior to your procedure: At 11:00 A.M. fill the Nulytely/Golytely jug to the fill line with lukewarm drinking water, cap the jug and shake to dissolve the powder.  Place the jug in the refrigerator. At 5:00 P.M. on the day PRIOR  to your procedure: drink one 8-ounce glass of Nulytely/Golytely and repeat every (10-15) minutes until you have finished the first three fourths gallon of Nulytely/Golytely (3) liters. Place remaining liquid in the refrigerator. Day of Procedure: Five hours before your scheduled procedure time drink the remaining Nulytely/Golytely 8-ounces at a time. Repeat every (10-15) minutes until you have finished.  Indications: emptying of the bowel    polypodium leucoto/niacinamide (HELIOCARE ADVANCED PO) Take 2 capsules by mouth daily.    propranolol LA (INDERAL LA) 60 mg capsule TAKE 1 CAPSULE BY MOUTH TWICE A DAY    rosuvastatin (CRESTOR) 20 mg tablet TAKE ONE TABLET BY MOUTH TWICE WEEKLY.    topiramate (TOPAMAX) 25 mg tablet Take 1 tab once a day for 1 week, then 1 tab twice a day for 1 week, then may take 1 tab in the morning and 2 at bedtime for 1 week, then may take 2 tabs twice a day  Indications: migraine prevention    traZODone (DESYREL) 150 mg tablet Take one tablet by mouth at bedtime as needed.     Vitals:    06/18/22 1403   BP: 130/72   BP Source: Arm, Left Upper   Pulse: 82   Temp: 36.3 ?C (97.4 ?F)   TempSrc: Temporal   PainSc: Zero   Weight: 95.3 kg (210 lb)   Height: 175.3 cm (5' 9.02)     Body mass index is 31 kg/m?Marland Kitchen     Physical Exam  GENERAL APPEARANCE: The patient is alert. Patient is in no acute distress, following commands and cooperative. Well developed and well nourished.   HEENT: Normocephalic and atraumatic.     NEUROLOGIC EXAM:   Orientation: The patient is alert and oriented times three. Patient is following commands. Speech is fluent, intact comprehension, repetition and naming.     Cranial nerves:  1st cranial nerve:  not tested   2nd cranial nerve: normal; Visual fields are full to confrontation. Pupils are equal, round, and reactive to light and accommodation.   3rd, 4th & 6th cranial nerves: Extraocular movements are intact without nystagmus.  5th cranial nerve: normal; intact muscles of mastication. Intact light touch and pin prick.  7th cranial nerve: normal; no facial asymmetry  8th cranial nerve: normal  9th & 10th cranial nerves: normal; Gag is present   11th cranial nerve: normal; Shoulder shrug symmetric   12th cranial nerve: normal; tongue is midline.      Strength: (Right/Left) Deltoid 5/5, Biceps 5/5, Triceps 5/5, Finger ext 5/5, interossei 5/5, Hip Flexion 5/5, Knee ext 5/5, Knee flex 5/5, Ankle dorsiflexion 5/5, Ankle plantarflexion 5/5. Normal bulk and tone in all four limbs without any evidence of an arm drift.  No abnormal movements during exam.  Sensory: Intact to pain, temperature and vibration in both upper/lower extremities.   Coordination is intact finger-to-nose.    Deep tendon reflexes are 2+ in biceps, triceps, brachioradialis and patellar bilaterally and 1+ ankle reflex.  Gait is normal based without ataxia, fair tandem gait.     Assessment and Plan:  Chronic tension type headaches. Patient thinks that her headaches have been significantly better in the last few months. She reports benefit and good tolerance with topiramate use. Patient describes headaches are less frequent/intense. She denies memory problems or other new neurological symptoms. Exam seems stable today. We will keep similar amount of Topiramate for now since headaches are stable.     Recommendations:  We will keep Topiramate 50mg  bid for headache prophylaxis and patient may  try a limited amount of NSAIDs as needed.    Follow-up appointment in 6 months.     Total time was 30 minutes. This time was spent preparing to see the patient, obtaining and/or reviewing history, performing an examination, ordering medications, tests/ procedures, communicating results to the patient, documenting clinical information in the electronic health record and counseling/educating the patient regarding headaches.

## 2022-06-19 ENCOUNTER — Encounter: Admit: 2022-06-19 | Discharge: 2022-06-19 | Payer: MEDICARE | Primary: Geriatric Medicine

## 2022-06-20 ENCOUNTER — Encounter: Admit: 2022-06-20 | Discharge: 2022-06-20 | Payer: MEDICARE | Primary: Geriatric Medicine

## 2022-06-20 ENCOUNTER — Ambulatory Visit: Admit: 2022-06-20 | Discharge: 2022-06-20 | Payer: MEDICARE | Primary: Geriatric Medicine

## 2022-06-20 DIAGNOSIS — B999 Unspecified infectious disease: Secondary | ICD-10-CM

## 2022-06-20 DIAGNOSIS — R0600 Dyspnea, unspecified: Secondary | ICD-10-CM

## 2022-06-20 DIAGNOSIS — K5901 Slow transit constipation: Secondary | ICD-10-CM

## 2022-06-20 DIAGNOSIS — I10 Essential (primary) hypertension: Secondary | ICD-10-CM

## 2022-06-20 DIAGNOSIS — J329 Chronic sinusitis, unspecified: Secondary | ICD-10-CM

## 2022-06-20 DIAGNOSIS — R430 Anosmia: Secondary | ICD-10-CM

## 2022-06-20 DIAGNOSIS — E785 Hyperlipidemia, unspecified: Secondary | ICD-10-CM

## 2022-06-20 DIAGNOSIS — I499 Cardiac arrhythmia, unspecified: Secondary | ICD-10-CM

## 2022-06-20 DIAGNOSIS — R002 Palpitations: Secondary | ICD-10-CM

## 2022-06-20 DIAGNOSIS — K219 Gastro-esophageal reflux disease without esophagitis: Secondary | ICD-10-CM

## 2022-06-20 DIAGNOSIS — Z780 Asymptomatic menopausal state: Secondary | ICD-10-CM

## 2022-06-20 DIAGNOSIS — J301 Allergic rhinitis due to pollen: Secondary | ICD-10-CM

## 2022-06-20 DIAGNOSIS — G43109 Migraine with aura, not intractable, without status migrainosus: Secondary | ICD-10-CM

## 2022-06-20 DIAGNOSIS — E0590 Thyrotoxicosis, unspecified without thyrotoxic crisis or storm: Secondary | ICD-10-CM

## 2022-06-20 DIAGNOSIS — F419 Anxiety disorder, unspecified: Secondary | ICD-10-CM

## 2022-06-20 DIAGNOSIS — H547 Unspecified visual loss: Secondary | ICD-10-CM

## 2022-06-20 DIAGNOSIS — H269 Unspecified cataract: Secondary | ICD-10-CM

## 2022-06-20 DIAGNOSIS — E039 Hypothyroidism, unspecified: Secondary | ICD-10-CM

## 2022-06-20 DIAGNOSIS — T7840XA Allergy, unspecified, initial encounter: Secondary | ICD-10-CM

## 2022-06-20 DIAGNOSIS — M549 Dorsalgia, unspecified: Secondary | ICD-10-CM

## 2022-06-20 DIAGNOSIS — D72819 Decreased white blood cell count, unspecified: Secondary | ICD-10-CM

## 2022-06-20 DIAGNOSIS — I4891 Unspecified atrial fibrillation: Secondary | ICD-10-CM

## 2022-06-20 DIAGNOSIS — R519 Chronic idiopathic facial pain: Secondary | ICD-10-CM

## 2022-06-20 MED ORDER — PROPOFOL 10 MG/ML IV EMUL 50 ML (INFUSION)(AM)(OR)
INTRAVENOUS | 0 refills | Status: DC
Start: 2022-06-20 — End: 2022-06-20

## 2022-06-21 ENCOUNTER — Encounter: Admit: 2022-06-21 | Discharge: 2022-06-21 | Payer: MEDICARE | Primary: Geriatric Medicine

## 2022-06-21 DIAGNOSIS — H269 Unspecified cataract: Secondary | ICD-10-CM

## 2022-06-21 DIAGNOSIS — G43109 Migraine with aura, not intractable, without status migrainosus: Secondary | ICD-10-CM

## 2022-06-21 DIAGNOSIS — K219 Gastro-esophageal reflux disease without esophagitis: Secondary | ICD-10-CM

## 2022-06-21 DIAGNOSIS — R430 Anosmia: Secondary | ICD-10-CM

## 2022-06-21 DIAGNOSIS — K5901 Slow transit constipation: Secondary | ICD-10-CM

## 2022-06-21 DIAGNOSIS — Z780 Asymptomatic menopausal state: Secondary | ICD-10-CM

## 2022-06-21 DIAGNOSIS — J301 Allergic rhinitis due to pollen: Secondary | ICD-10-CM

## 2022-06-21 DIAGNOSIS — E785 Hyperlipidemia, unspecified: Secondary | ICD-10-CM

## 2022-06-21 DIAGNOSIS — F419 Anxiety disorder, unspecified: Secondary | ICD-10-CM

## 2022-06-21 DIAGNOSIS — I4891 Unspecified atrial fibrillation: Secondary | ICD-10-CM

## 2022-06-21 DIAGNOSIS — R0600 Dyspnea, unspecified: Secondary | ICD-10-CM

## 2022-06-21 DIAGNOSIS — E039 Hypothyroidism, unspecified: Secondary | ICD-10-CM

## 2022-06-21 DIAGNOSIS — M549 Dorsalgia, unspecified: Secondary | ICD-10-CM

## 2022-06-21 DIAGNOSIS — B999 Unspecified infectious disease: Secondary | ICD-10-CM

## 2022-06-21 DIAGNOSIS — T7840XA Allergy, unspecified, initial encounter: Secondary | ICD-10-CM

## 2022-06-21 DIAGNOSIS — H547 Unspecified visual loss: Secondary | ICD-10-CM

## 2022-06-21 DIAGNOSIS — I499 Cardiac arrhythmia, unspecified: Secondary | ICD-10-CM

## 2022-06-21 DIAGNOSIS — R519 Chronic idiopathic facial pain: Secondary | ICD-10-CM

## 2022-06-21 DIAGNOSIS — I10 Essential (primary) hypertension: Secondary | ICD-10-CM

## 2022-06-21 DIAGNOSIS — D72819 Decreased white blood cell count, unspecified: Secondary | ICD-10-CM

## 2022-06-21 DIAGNOSIS — J329 Chronic sinusitis, unspecified: Secondary | ICD-10-CM

## 2022-06-21 DIAGNOSIS — R002 Palpitations: Secondary | ICD-10-CM

## 2022-06-21 DIAGNOSIS — E0590 Thyrotoxicosis, unspecified without thyrotoxic crisis or storm: Secondary | ICD-10-CM

## 2022-06-28 ENCOUNTER — Encounter: Admit: 2022-06-28 | Discharge: 2022-06-28 | Payer: MEDICARE | Primary: Geriatric Medicine

## 2022-07-30 ENCOUNTER — Encounter: Admit: 2022-07-30 | Discharge: 2022-07-30 | Payer: MEDICARE | Primary: Geriatric Medicine

## 2022-07-30 MED ORDER — TOPIRAMATE 25 MG PO TAB
ORAL_TABLET | 6 refills
Start: 2022-07-30 — End: ?

## 2022-08-27 ENCOUNTER — Encounter: Admit: 2022-08-27 | Discharge: 2022-08-27 | Payer: MEDICARE | Primary: Geriatric Medicine

## 2022-08-27 MED ORDER — IPRATROPIUM BROMIDE 42 MCG (0.06 %) NA SPRY
1-2 | Freq: Four times a day (QID) | NASAL | 3 refills | 30.00000 days | Status: AC | PRN
Start: 2022-08-27 — End: ?

## 2022-08-27 MED ORDER — IPRATROPIUM BROMIDE 42 MCG (0.06 %) NA SPRY
7 refills | 30.00000 days | Status: DC
Start: 2022-08-27 — End: 2022-08-27

## 2022-08-27 MED ORDER — TOPIRAMATE 25 MG PO TAB
ORAL_TABLET | 2 refills | Status: AC
Start: 2022-08-27 — End: ?

## 2022-08-27 NOTE — Telephone Encounter
Pharmacy requesting a refill of ipratropium bromide (ATROVENT) 42 mcg (0.06 %) nasal spray . Patient last seen 04/09/2022. Follow up scheduled 10/18/2022. Per last OV note, Continue Atrovent (ipratropium) 1-2 puffs in each nostril up to three times a day as needed for sneezing, post nasal drip, runny nose. Point nozzle outwards to help prevent nosebleeds. . Refilling atrovent per Allergy/Immunology Standing Orders Protocol for 8 months.

## 2022-08-27 NOTE — Telephone Encounter
Pharmacy is requesting  directions to ipratropium bromide (ATROVENT) 42 mcg (0.06 %) nasal spray     LOV: 01/01/2022      Follow up: 10/18/2022    Routing to Dr.D'Mello for approval if warranted

## 2022-08-27 NOTE — Telephone Encounter
Received refill request for Topamax. Last fill date 07/31/2022. Last OV 06/18/2022, medication included in plan of care. Next OV scheduled for 02/18/2023. MyChart message sent to patient regarding request, as more refills available at pharmacy.     Orvilla Fus, RN

## 2022-08-29 ENCOUNTER — Ambulatory Visit: Admit: 2022-08-29 | Discharge: 2022-08-30 | Payer: MEDICARE | Primary: Geriatric Medicine

## 2022-08-29 ENCOUNTER — Encounter: Admit: 2022-08-29 | Discharge: 2022-08-29 | Payer: MEDICARE | Primary: Geriatric Medicine

## 2022-08-29 DIAGNOSIS — R002 Palpitations: Secondary | ICD-10-CM

## 2022-08-29 DIAGNOSIS — M549 Dorsalgia, unspecified: Secondary | ICD-10-CM

## 2022-08-29 DIAGNOSIS — E559 Vitamin D deficiency, unspecified: Secondary | ICD-10-CM

## 2022-08-29 DIAGNOSIS — D126 Benign neoplasm of colon, unspecified: Secondary | ICD-10-CM

## 2022-08-29 DIAGNOSIS — N1831 Stage 3a chronic kidney disease (HCC): Secondary | ICD-10-CM

## 2022-08-29 DIAGNOSIS — J301 Allergic rhinitis due to pollen: Secondary | ICD-10-CM

## 2022-08-29 DIAGNOSIS — E0590 Thyrotoxicosis, unspecified without thyrotoxic crisis or storm: Secondary | ICD-10-CM

## 2022-08-29 DIAGNOSIS — I480 Paroxysmal atrial fibrillation: Secondary | ICD-10-CM

## 2022-08-29 DIAGNOSIS — G43109 Migraine with aura, not intractable, without status migrainosus: Secondary | ICD-10-CM

## 2022-08-29 DIAGNOSIS — H547 Unspecified visual loss: Secondary | ICD-10-CM

## 2022-08-29 DIAGNOSIS — R519 Chronic idiopathic facial pain: Secondary | ICD-10-CM

## 2022-08-29 DIAGNOSIS — I10 Essential (primary) hypertension: Secondary | ICD-10-CM

## 2022-08-29 DIAGNOSIS — K219 Gastro-esophageal reflux disease without esophagitis: Secondary | ICD-10-CM

## 2022-08-29 DIAGNOSIS — K5901 Slow transit constipation: Secondary | ICD-10-CM

## 2022-08-29 DIAGNOSIS — F419 Anxiety disorder, unspecified: Secondary | ICD-10-CM

## 2022-08-29 DIAGNOSIS — H269 Unspecified cataract: Secondary | ICD-10-CM

## 2022-08-29 DIAGNOSIS — J329 Chronic sinusitis, unspecified: Secondary | ICD-10-CM

## 2022-08-29 DIAGNOSIS — Z889 Allergy status to unspecified drugs, medicaments and biological substances status: Secondary | ICD-10-CM

## 2022-08-29 DIAGNOSIS — M159 Polyosteoarthritis, unspecified: Secondary | ICD-10-CM

## 2022-08-29 DIAGNOSIS — R430 Anosmia: Secondary | ICD-10-CM

## 2022-08-29 DIAGNOSIS — E039 Hypothyroidism, unspecified: Secondary | ICD-10-CM

## 2022-08-29 DIAGNOSIS — E782 Mixed hyperlipidemia: Secondary | ICD-10-CM

## 2022-08-29 DIAGNOSIS — I499 Cardiac arrhythmia, unspecified: Secondary | ICD-10-CM

## 2022-08-29 DIAGNOSIS — Z780 Asymptomatic menopausal state: Secondary | ICD-10-CM

## 2022-08-29 DIAGNOSIS — I4891 Unspecified atrial fibrillation: Secondary | ICD-10-CM

## 2022-08-29 DIAGNOSIS — R0600 Dyspnea, unspecified: Secondary | ICD-10-CM

## 2022-08-29 DIAGNOSIS — E785 Hyperlipidemia, unspecified: Secondary | ICD-10-CM

## 2022-08-29 DIAGNOSIS — B999 Unspecified infectious disease: Secondary | ICD-10-CM

## 2022-08-29 DIAGNOSIS — T7840XA Allergy, unspecified, initial encounter: Secondary | ICD-10-CM

## 2022-08-29 DIAGNOSIS — D72819 Decreased white blood cell count, unspecified: Secondary | ICD-10-CM

## 2022-08-29 MED ORDER — LORATADINE 10 MG PO TAB
10 mg | ORAL_TABLET | Freq: Every morning | ORAL | 3 refills | 28.00000 days | Status: AC
Start: 2022-08-29 — End: ?

## 2022-08-29 NOTE — Patient Instructions
Restart claritin for the cough and use plain mucinex or salin enasal spray    Please get labs drawn before your next visit. You should be fasting for 8-12 hours but you can and should drink water or black coffee and take your morning medications except diabetic medications. Wait until you eat to take diabetic medications.

## 2022-08-30 LAB — BASIC METABOLIC PANEL
BLD UREA NITROGEN: 24 mg/dL (ref 7–25)
CALCIUM: 10 mg/dL (ref 8.5–10.6)
CO2: 23 MMOL/L (ref 21–30)
CREATININE: 1 mg/dL — ABNORMAL HIGH (ref 0.4–1.00)
EGFR: 59 mL/min — ABNORMAL LOW (ref >60)
SODIUM: 139 MMOL/L (ref 137–147)

## 2022-08-30 LAB — TOTAL THYROXINE T4: TOTAL THYROXINE: 10 ug/dL — ABNORMAL HIGH (ref 6.1–12.3)

## 2022-08-30 LAB — TOTAL T3 (TRIIODOTHYRONINE): TOTAL T3: 71 ng/dL — ABNORMAL LOW (ref 87–180)

## 2022-08-30 LAB — MAGNESIUM: MAGNESIUM: 2.5 mg/dL — ABNORMAL LOW (ref >60)

## 2022-08-30 LAB — VITAMIN B12: VITAMIN B12: 854 pg/mL (ref 180–914)

## 2022-09-02 ENCOUNTER — Encounter: Admit: 2022-09-02 | Discharge: 2022-09-02 | Payer: MEDICARE | Primary: Geriatric Medicine

## 2022-09-02 DIAGNOSIS — R053 Chronic cough: Secondary | ICD-10-CM

## 2022-09-02 DIAGNOSIS — R519 Chronic idiopathic facial pain: Secondary | ICD-10-CM

## 2022-09-02 DIAGNOSIS — J301 Allergic rhinitis due to pollen: Secondary | ICD-10-CM

## 2022-09-02 DIAGNOSIS — I499 Cardiac arrhythmia, unspecified: Secondary | ICD-10-CM

## 2022-09-02 DIAGNOSIS — R0600 Dyspnea, unspecified: Secondary | ICD-10-CM

## 2022-09-02 DIAGNOSIS — K5901 Slow transit constipation: Secondary | ICD-10-CM

## 2022-09-02 DIAGNOSIS — R430 Anosmia: Secondary | ICD-10-CM

## 2022-09-02 DIAGNOSIS — J329 Chronic sinusitis, unspecified: Secondary | ICD-10-CM

## 2022-09-02 DIAGNOSIS — F419 Anxiety disorder, unspecified: Secondary | ICD-10-CM

## 2022-09-02 DIAGNOSIS — Z780 Asymptomatic menopausal state: Secondary | ICD-10-CM

## 2022-09-02 DIAGNOSIS — I4891 Unspecified atrial fibrillation: Secondary | ICD-10-CM

## 2022-09-02 DIAGNOSIS — T7840XA Allergy, unspecified, initial encounter: Secondary | ICD-10-CM

## 2022-09-02 DIAGNOSIS — R002 Palpitations: Secondary | ICD-10-CM

## 2022-09-02 DIAGNOSIS — N1831 Stage 3a chronic kidney disease (HCC): Secondary | ICD-10-CM

## 2022-09-02 DIAGNOSIS — I10 Essential (primary) hypertension: Secondary | ICD-10-CM

## 2022-09-02 DIAGNOSIS — K219 Gastro-esophageal reflux disease without esophagitis: Secondary | ICD-10-CM

## 2022-09-02 DIAGNOSIS — E039 Hypothyroidism, unspecified: Secondary | ICD-10-CM

## 2022-09-02 DIAGNOSIS — D72819 Decreased white blood cell count, unspecified: Secondary | ICD-10-CM

## 2022-09-02 DIAGNOSIS — M549 Dorsalgia, unspecified: Secondary | ICD-10-CM

## 2022-09-02 DIAGNOSIS — B999 Unspecified infectious disease: Secondary | ICD-10-CM

## 2022-09-02 DIAGNOSIS — H547 Unspecified visual loss: Secondary | ICD-10-CM

## 2022-09-02 DIAGNOSIS — E785 Hyperlipidemia, unspecified: Secondary | ICD-10-CM

## 2022-09-02 DIAGNOSIS — E0590 Thyrotoxicosis, unspecified without thyrotoxic crisis or storm: Secondary | ICD-10-CM

## 2022-09-02 DIAGNOSIS — H269 Unspecified cataract: Secondary | ICD-10-CM

## 2022-09-02 DIAGNOSIS — G43109 Migraine with aura, not intractable, without status migrainosus: Secondary | ICD-10-CM

## 2022-09-14 ENCOUNTER — Encounter: Admit: 2022-09-14 | Discharge: 2022-09-14 | Payer: MEDICARE | Primary: Geriatric Medicine

## 2022-09-18 ENCOUNTER — Encounter: Admit: 2022-09-18 | Discharge: 2022-09-18 | Payer: MEDICARE | Primary: Geriatric Medicine

## 2022-09-18 NOTE — Telephone Encounter
This message is regarding your Renal Artery Duplex Scan on 09/19/2022 we have some patient instructions for you we ask that:     8 hours prior to your appointment, we ask you have no food or liquids other than water. No smoking, no tobacco, or chewing gum. You may still take medication as usual in the morning.      Please arrive 15 minutes prior to your scheduled appointment.

## 2022-09-19 ENCOUNTER — Ambulatory Visit: Admit: 2022-09-19 | Discharge: 2022-09-20 | Payer: MEDICARE | Primary: Geriatric Medicine

## 2022-09-19 ENCOUNTER — Encounter: Admit: 2022-09-19 | Discharge: 2022-09-19 | Payer: MEDICARE | Primary: Geriatric Medicine

## 2022-09-19 DIAGNOSIS — N1831 Chronic kidney disease, stage 3a (HCC): Secondary | ICD-10-CM

## 2022-09-24 ENCOUNTER — Ambulatory Visit: Admit: 2022-09-24 | Discharge: 2022-09-25 | Payer: MEDICARE | Primary: Geriatric Medicine

## 2022-09-24 ENCOUNTER — Encounter: Admit: 2022-09-24 | Discharge: 2022-09-24 | Payer: MEDICARE | Primary: Geriatric Medicine

## 2022-09-24 DIAGNOSIS — N1831 Stage 3a chronic kidney disease (HCC): Principal | ICD-10-CM

## 2022-09-27 ENCOUNTER — Encounter: Admit: 2022-09-27 | Discharge: 2022-09-27 | Payer: MEDICARE | Primary: Geriatric Medicine

## 2022-09-27 MED ORDER — LEVOTHYROXINE 25 MCG PO TAB
25 ug | ORAL_TABLET | Freq: Every day | ORAL | 0 refills | 30.00000 days | Status: AC
Start: 2022-09-27 — End: ?

## 2022-10-18 ENCOUNTER — Ambulatory Visit: Admit: 2022-10-18 | Discharge: 2022-10-18 | Payer: MEDICARE | Primary: Geriatric Medicine

## 2022-10-18 ENCOUNTER — Encounter: Admit: 2022-10-18 | Discharge: 2022-10-18 | Payer: MEDICARE | Primary: Geriatric Medicine

## 2022-10-18 DIAGNOSIS — J309 Allergic rhinitis, unspecified: Secondary | ICD-10-CM

## 2022-10-18 DIAGNOSIS — E782 Mixed hyperlipidemia: Secondary | ICD-10-CM

## 2022-10-18 DIAGNOSIS — I10 Essential (primary) hypertension: Secondary | ICD-10-CM

## 2022-10-18 NOTE — Progress Notes
Date of Service: 10/18/2022  Date of last contact with Allergy/Immunology: 08/27/2022  Date of last office visit encounter with Dr.D'Mello: 04/09/2022         Subjective:  I had the pleasure of seeing Jasmine Lynch who presents today to the  Allergy & Immunology clinic for a follow up visit.  As you know, she is a 67 y.o. female with reflux, hypertension, chronic sinusitis, allergic rhinitis, anxiety, back pain, hyperlipidemia, migraines.     Chief Complaint   Patient presents with    Follow Up       History of Present Illness:   Allergic rhinitis and allergic conjunctivitis:   - Continues to use the ipratropium 1 SPEN 2 times a week. She tolerates it well. Just drys her nasal passages. It does help with drainage.   - She will use her Navage to help with the nasal dryness. Not having any nosebleeds.   - Her ENT specialist has her on azelastine 2 SPEN twice a day. It helps with the post nasal drainage as well   - Not on any steroid nasal spray.   - She continues on loratadine 10 mg daily. And finds this helpful as well.     She is happy with her current medication regimen. Does not want to make any changes.      There were no new changes to the patient's medical history, surgical history, family medical and social/environmental history.      Rhinitis Control Assessment Test  #1. During the past week, how often did you have nasal congestion?: 3 (10/11/2022  2:10 PM)  #2. During the past week, how often did you sneeze?: 3 (10/11/2022  2:10 PM)  #3. During the past week, how often did you have watery eyes?: 3 (10/11/2022  2:10 PM)  #4. During the past week, to what extent did your nasal or other allergy symptoms interfere with your sleep?: 5 (10/11/2022  2:10 PM)  #5. During the past week, how well were your nasal or other allergy symptoms controlled?: 4 (10/11/2022  2:10 PM)  #6. During the past week, how often did you avoid any activities (for example, visiting a house with a dog or cat, or gardening) because of your nasal or other allergy symptoms?: 5 (10/11/2022  2:10 PM)  RCAT Total Score: 23 (10/11/2022  2:10 PM)      Asthma Control Test  #1. In the past 4 weeks, how much of the time did your asthma keep you from getting as much done at work, school or at home?: 5 (10/11/2022  2:08 PM)  #2. During the past 4 weeks, how often have you had shortness of breath?: 5-Not at all (10/11/2022  2:08 PM)  #3. During the past 4 weeks, how often did your asthma symptoms (wheezing, coughing, shortness of breath, chest tightness or pain) wake you up at night or earlier than usual in the morning?: 5 (10/11/2022  2:08 PM)  #4. During the past 4 weeks, how often have you used your rescue inhaler or nebulizer medication (such as albuterol)?: 5 (10/11/2022  2:08 PM)  #5. How would you rate your asthma control during the past 4 weeks?: 5 (10/11/2022  2:08 PM)  Total Score: 25 (10/11/2022  2:08 PM)                Review of Systems    A 10 point review of systems has been reviewed and the remainder are all negative other than as noted above and as in  the HPI.  Any chronic issues are being addressed by a variety of physicians other than as is noted in the HPI.    Objective:         Medications   ALPRAZolam (XANAX) 0.25 mg tablet Take one tablet by mouth at bedtime as needed.    aspirin 81 mg chewable tablet Chew one tablet by mouth daily.      azelastine (ASTELIN) 137 mcg (0.1 %) nasal spray Apply one spray to each nostril as directed at bedtime daily.    COQ10 (UBIQUINOL) PO Take 400 mg by mouth daily.    FLAXSEED OIL PO Take 1,200 mg by mouth daily.      ipratropium bromide (ATROVENT) 42 mcg (0.06 %) nasal spray Apply one spray to two sprays to each nostril as directed four times daily as needed.    Lactobacillus acidophilus (PROBIOTIC PO) Take 1 capsule by mouth daily.    levothyroxine (SYNTHROID) 25 mcg tablet TAKE 1 TABLET BY MOUTH EVERY DAY    loratadine (CLARITIN) 10 mg tablet Take one tablet by mouth every morning.    magnesium citrate 125 mg cap Take 1 capsule by mouth twice daily.    multivitamin/iron/folic acid (CENTRUM WOMEN PO) Take 1 tablet by mouth daily.    nortriptyline (PAMELOR) 75 mg capsule Take one capsule by mouth at bedtime daily.    pantoprazole DR (PROTONIX) 40 mg tablet Take one tablet by mouth daily.    polypodium leucoto/niacinamide (HELIOCARE ADVANCED PO) Take 2 capsules by mouth daily.    propranolol LA (INDERAL LA) 60 mg capsule TAKE 1 CAPSULE BY MOUTH TWICE A DAY    rosuvastatin (CRESTOR) 20 mg tablet TAKE ONE TABLET BY MOUTH TWICE WEEKLY.    topiramate (TOPAMAX) 25 mg tablet TAKE 1 TAB ONCE A DAY FOR 1 WEEK, THEN 1 TAB TWICE A DAY FOR 1 WEEK, THEN MAY TAKE 1 TAB IN THE MORNING AND 2 AT BEDTIME FOR 1 WEEK, THEN MAY TAKE 2 TABS TWICE A DAY INDICATIONS: MIGRAINE PREVENTION    traZODone (DESYREL) 150 mg tablet Take one tablet by mouth at bedtime as needed.     No Known Allergies  Vitals:    10/18/22 1400   BP: 135/70   Pulse: 81   SpO2: 100%   Weight: 90.7 kg (200 lb)   Height: 172.7 cm (5' 8)           Physical Exam  Vitals reviewed.   Constitutional:       General: She is not in acute distress.     Appearance: Normal appearance. She is not ill-appearing.   HENT:      Head: Normocephalic and atraumatic.      Right Ear: Tympanic membrane, ear canal and external ear normal.      Left Ear: Tympanic membrane, ear canal and external ear normal.      Nose: Nose normal. No congestion or rhinorrhea.      Comments: Crusted nasal passages. Not overly dry. Not a lot of drainage or secretions. No obstruction of the nasal passages.      Mouth/Throat:      Mouth: Mucous membranes are moist.      Pharynx: Oropharynx is clear. No oropharyngeal exudate or posterior oropharyngeal erythema.   Eyes:      General:         Right eye: No discharge.         Left eye: No discharge.      Extraocular Movements: Extraocular movements intact.  Conjunctiva/sclera: Conjunctivae normal.      Pupils: Pupils are equal, round, and reactive to light.   Cardiovascular:      Rate and Rhythm: Normal rate and regular rhythm.      Pulses: Normal pulses.      Heart sounds: Normal heart sounds. No murmur heard.  Pulmonary:      Effort: Pulmonary effort is normal. No respiratory distress.      Breath sounds: Normal breath sounds. No wheezing.   Skin:     General: Skin is warm and dry.      Findings: No erythema or rash.   Neurological:      Mental Status: She is alert.                      Assessment and Plan:  1) Allergic rhinoconjunctivitis  Symptoms controlled to patient satisfaction on current medication regimen. She would like to continue on this regimen without any changes.     Plan:   - Continue Atrovent (ipratropium) 1-2 puffs in each nostril up to three times a day as needed for sneezing, post nasal drip, runny nose. Point nozzle outwards to help prevent nosebleeds.  - Continue Astelin 2 SPEN BID per ENT.   - Continue loratadine 10 mg daily.   - Continue with as needed nasal saline navage.   - Can add AYR nasal saline gel as needed to help reduce drying of nasal passages  - Implement aeroallergen avoidance measures.      RTC in 12 months or sooner as needed.     Thank you for allowing [place] to participate in this patient's care. Please feel free to contact us with any questions or concerns.    Greer Pickerel, MD  Assistant Clinical Professor   Division of Allergy, Immunology, and Rheumatology  Department of Internal Medicine and Department of Pediatrics  [facility]             [place] [facility] All[facility]ist: Dr. Albin Felling at [facility]

## 2022-10-19 LAB — CBC
HEMATOCRIT: 37 % — ABNORMAL HIGH (ref 36–45)
HEMOGLOBIN: 12 g/dL — ABNORMAL HIGH (ref <100)
MCH: 31 pg (ref 26–34)
MCHC: 33 g/dL (ref 32.0–36.0)
MCV: 93 FL (ref 80–100)
MPV: 8.7 FL (ref 7–11)
PLATELET COUNT: 269 10*3/uL (ref 150–400)
RBC COUNT: 3.9 M/UL — ABNORMAL LOW (ref >40)
RDW: 14 % (ref 11–15)
WBC COUNT: 4.4 10*3/uL — ABNORMAL LOW (ref <150)

## 2022-10-19 LAB — LIPID PROFILE: CHOLESTEROL: 216 mg/dL — ABNORMAL HIGH (ref <200)

## 2022-10-19 LAB — COMPREHENSIVE METABOLIC PANEL
ALT: 19 U/L (ref 7–56)
ANION GAP: 10 (ref 3–12)
CO2: 25 MMOL/L (ref 21–30)
EGFR: 56 mL/min — ABNORMAL LOW (ref >60)
SODIUM: 139 MMOL/L (ref 137–147)

## 2022-10-29 ENCOUNTER — Encounter: Admit: 2022-10-29 | Discharge: 2022-10-29 | Payer: MEDICARE | Primary: Geriatric Medicine

## 2022-12-15 ENCOUNTER — Encounter
Admit: 2022-12-15 | Discharge: 2022-12-15 | Payer: MEDICARE | Primary: Student in an Organized Health Care Education/Training Program

## 2022-12-23 ENCOUNTER — Encounter
Admit: 2022-12-23 | Discharge: 2022-12-23 | Payer: MEDICARE | Primary: Student in an Organized Health Care Education/Training Program

## 2023-01-01 ENCOUNTER — Encounter
Admit: 2023-01-01 | Discharge: 2023-01-01 | Payer: MEDICARE | Primary: Student in an Organized Health Care Education/Training Program

## 2023-01-01 ENCOUNTER — Ambulatory Visit
Admit: 2023-01-01 | Discharge: 2023-01-02 | Payer: MEDICARE | Primary: Student in an Organized Health Care Education/Training Program

## 2023-01-01 ENCOUNTER — Ambulatory Visit
Admit: 2023-01-01 | Discharge: 2023-01-01 | Payer: MEDICARE | Primary: Student in an Organized Health Care Education/Training Program

## 2023-01-01 DIAGNOSIS — G44221 Chronic tension-type headache, intractable: Secondary | ICD-10-CM

## 2023-01-01 NOTE — Progress Notes
Date of Service: 01/01/2023    Subjective:             Jasmine Lynch is a 67 y.o. female here for follow up due to headaches.     History of Present Illness  Jasmine Lynch is a 67 y.o. female with a past medical history of atrial fibrillation, chronic sinusitis, HTN, hyperlipidemia and hypothyroidism, here for follow up due to chronic headaches. Patient describes headaches for years, they were localized on frontal area, pain described as pressure sensation, they were not associated with nausea, visual aura, phonophobia or photophobia; denies autonomic symptoms with them, but menstrual cycles, stress, weather changes and COVID infection triggered headaches in the past. Headaches have significantly improved with time, she denies recent headaches in the  past few months. Patient also denies dizziness, neck pain, paresthesias or recent falls. She denies recent mood changes or sleep problems .     Headache medications:  Preventive:   Topiramate 50mg  bid (reports benefit, denies side effects)  Nortriptyline 75mg  qday (reports some benefit for sleep and headaches are not as intense and denies side effects)   Propranolol 60mg  bid (prescribed for heart issues, denies benefit for headaches)  Tried Gabapentin (denies benefit) and Aimovig 140mg  qmonth (denies clear benefit)     Abortive:   Tylenol/Ibuprofen with some benefit (not taking lately), denies taking nausea meds  She denies taking any other over-the-counter medication recently.          Objective:         ALPRAZolam (XANAX) 0.25 mg tablet Take one tablet by mouth at bedtime as needed.    aspirin 81 mg chewable tablet Chew one tablet by mouth daily.      azelastine (ASTELIN) 137 mcg (0.1 %) nasal spray Apply one spray to each nostril as directed at bedtime daily.    COQ10 (UBIQUINOL) PO Take 400 mg by mouth daily.    FLAXSEED OIL PO Take 1,200 mg by mouth daily.      ipratropium bromide (ATROVENT) 42 mcg (0.06 %) nasal spray Apply one spray to two sprays to each nostril as directed four times daily as needed.    Lactobacillus acidophilus (PROBIOTIC PO) Take 1 capsule by mouth daily.    levothyroxine (SYNTHROID) 25 mcg tablet TAKE 1 TABLET BY MOUTH EVERY DAY    loratadine (CLARITIN) 10 mg tablet Take one tablet by mouth every morning.    magnesium citrate 125 mg cap Take one capsule by mouth twice daily.    multivitamin/iron/folic acid (CENTRUM WOMEN PO) Take 1 tablet by mouth daily.    nortriptyline (PAMELOR) 75 mg capsule Take one capsule by mouth at bedtime daily.    pantoprazole DR (PROTONIX) 40 mg tablet Take one tablet by mouth daily.    polypodium leucoto/niacinamide (HELIOCARE ADVANCED PO) Take 2 capsules by mouth daily.    propranolol LA (INDERAL LA) 60 mg capsule Take one capsule by mouth twice daily.    rosuvastatin (CRESTOR) 20 mg tablet Take one tablet by mouth every M-W-F    topiramate (TOPAMAX) 25 mg tablet TAKE 1 TAB ONCE A DAY FOR 1 WEEK, THEN 1 TAB TWICE A DAY FOR 1 WEEK, THEN MAY TAKE 1 TAB IN THE MORNING AND 2 AT BEDTIME FOR 1 WEEK, THEN MAY TAKE 2 TABS TWICE A DAY INDICATIONS: MIGRAINE PREVENTION    traZODone (DESYREL) 150 mg tablet Take one tablet by mouth at bedtime as needed.     Vitals:    01/01/23 1155   BP: Marland Kitchen)  149/81   BP Source: Arm, Left Upper   Pulse: 72   Resp: 18   SpO2: 98%   PainSc: Zero   Weight: 97.1 kg (214 lb)   Height: 172.7 cm (5' 7.99)     Body mass index is 32.55 kg/m?Marland Kitchen     Physical Exam  GENERAL APPEARANCE: The patient is alert. Patient is in no acute distress, following commands and cooperative. Well developed and well nourished.   HEENT: Normocephalic and atraumatic.     NEUROLOGIC EXAM:   Orientation: The patient is alert and oriented times three. Patient is following commands. Speech is fluent, intact comprehension, repetition and naming.     Cranial nerves:  1st cranial nerve:  not tested   2nd cranial nerve: normal; Visual fields are full to confrontation. Pupils are equal, round, and reactive to light and accommodation. 3rd, 4th & 6th cranial nerves: Extraocular movements are intact without nystagmus.  5th cranial nerve: normal; intact muscles of mastication. Intact light touch and pin prick.  7th cranial nerve: normal; no facial asymmetry  8th cranial nerve: normal  9th & 10th cranial nerves: normal; Gag is present   11th cranial nerve: normal; Shoulder shrug symmetric   12th cranial nerve: normal; tongue is midline.      Strength: (Right/Left) Deltoid 5/5, Biceps 5/5, Triceps 5/5, Finger ext 5/5, interossei 5/5, Hip Flexion 5/5, Knee ext 5/5, Knee flex 5/5, Ankle dorsiflexion 5/5, Ankle plantarflexion 5/5. Normal bulk and tone in all four limbs without any evidence of an arm drift.  No abnormal movements during exam.  Sensory: Intact to pain, temperature and vibration in both upper/lower extremities.   Coordination is intact finger-to-nose.    Deep tendon reflexes are 2+ in biceps, triceps, brachioradialis and patellar bilaterally and 1+ ankle reflex.  Gait is normal based without ataxia.     Assessment and Plan:  Chronic headaches. Patient thinks that headaches have significantly improved lately, she denies headaches in the past few months. She reports benefit and good tolerance with topiramate use. Patient also denies other new neurological symptoms and exam seems unremarkable today. We will keep same amount of Topiramate since headaches have been stable, but we may wean medication off in the future if possible if symptoms remain stable.     Recommendations:  We will keep Topiramate 50mg  bid for headache prophylaxis.   Follow-up appointment in 12 months.     Total time was 30 minutes. This time was spent preparing to see the patient, obtaining and/or reviewing history, performing an examination, ordering medications, tests/ procedures, communicating results to the patient, documenting clinical information in the electronic health record and counseling/educating the patient regarding headaches.

## 2023-01-14 NOTE — Assessment & Plan Note
She's been on propranolol for around 10 years and hasn't had any recurrence of sustained tachycardia.

## 2023-01-14 NOTE — Assessment & Plan Note
Lab Results   Component Value Date    CHOL 216 (H) 10/18/2022    TRIG 74 10/18/2022    HDL 91 10/18/2022    LDL 102 (H) 10/18/2022    VLDL 15 10/18/2022    NONHDLCHOL 125 10/18/2022    CHOLHDLC 2 09/16/2018      Her coronary calcium score was only 1 in January, 2024, so I've told her that I think LDL < 100 on rosuva 20 mg/day is reasonable.

## 2023-01-15 ENCOUNTER — Encounter
Admit: 2023-01-15 | Discharge: 2023-01-15 | Payer: MEDICARE | Primary: Student in an Organized Health Care Education/Training Program

## 2023-01-15 ENCOUNTER — Ambulatory Visit
Admit: 2023-01-15 | Discharge: 2023-01-15 | Payer: MEDICARE | Primary: Student in an Organized Health Care Education/Training Program

## 2023-01-15 DIAGNOSIS — E782 Mixed hyperlipidemia: Secondary | ICD-10-CM

## 2023-01-15 DIAGNOSIS — I10 Essential (primary) hypertension: Secondary | ICD-10-CM

## 2023-01-15 DIAGNOSIS — I471 PSVT (paroxysmal supraventricular tachycardia) (HCC): Secondary | ICD-10-CM

## 2023-01-15 NOTE — Progress Notes
Date of Service: 01/15/2023    Jasmine Lynch is a 68 y.o. female.       HPI     Jasmine Lynch was in the [place] [facility] today for follow-up regarding her history of PSVT and perhaps atrial fibrillation.  She reports that her palpitations have essentially resolved.  She does not really have any other symptoms.       She denies any problems with exertional chest discomfort or breathlessness.  She has had no trouble with TIA or stroke symptoms.  She denies any syncope or near syncope.         Vitals:    01/15/23 1443   BP: 128/83   BP Source: Arm, Left Upper   Pulse: 87   SpO2: 99%   O2 Device: None (Room air)   PainSc: Zero   Weight: 97.1 kg (214 lb)   Height: 175.3 cm (5' 9)     Body mass index is 31.6 kg/m?Marland Kitchen     Past Medical History  Patient Active Problem List    Diagnosis Date Noted    Chronic cough 09/02/2022     CT 02/2022 with bronchiectasis      Stage 3a chronic kidney disease (HCC) 08/29/2022    Adenomatous polyp of colon 06/28/2022     Next screening colonoscopy in 7 years, 2031.       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 She had seen an Allergist in her 20's and found to have positive aeroallergen testing, started on Allergy shots and remained on them for 20 years due to benefit. Repeat testing at [facility] in 2015 was negative. Currently, symptoms poorly controlled on current medication regimen. She had a septoplasty in 2008 with Dr. Gordy Levan; no further sinus surgeries. 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     Family Hx negative for similar rhinitis symptoms.   Pet Hx: No pets at home.     Current medication: Nasacort 2 SPEN once daily. Improper technique.     Impression:  Allergic vs non allergic rhinoconjunctivitis vs mixed.   We discussed that although she does have a history of allergic rhinitis, the differential diagnoses for rhinitis is broad and include allergic rhinitis, non-allergic rhinitis/vasomotor rhinitis, gustatory rhinitis, allergic fungal rhinosinusitis, chronic rhinosinusitis, ANCA-associated vasculitis, migraine or other headache disorder, dental decay/dental disease affecting sinuses, trauma related rhinitis, hormonal rhinitis, and/or other etiologies.     01/01/22: Aeroallergen SPT was positive to two trees with appropriate control response.     Allergic rhinoconjunctivitis education:   Reviewed testing results. Results provides an explanation for patients' symptom during the spring. 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   Reviewed treatment goals of prevention of symptoms: (1) aeroallergen avoidance measures (2) medication management (3) allergen immunotherapy.   Reviewed aeroallergen avoidance measures.   We discussed aeroallergen immunotherapy but at this time, patient would like to focus on avoidance measures and medication management.      Medication review:   Discussed current medication dosage, use, side effects.   Reviewed appropriate technique for nasal spray use.      Patient questions and concerns were addressed at this visit.       Plan:   - Continue Nasacort 2 puffs each nostril once daily. Point nozzle outwards to help prevent nosebleeds.   -  Start Atrovent (ipratropium) 1-2 puffs in each nostril up to three times a day as needed for sneezing, post nasal drip, runny nose. Point nozzle outwards to help prevent nosebleeds.  - Can add nasal saline spray as needed to help reduce drying of nasal passages. Best to use before prescription nasal spray.   - Implement aeroallergen avoidance measures as discussed. Provided in AVS.       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 No history of recent trauma, surgery. No epistaxis. Discussed with patient that the nasal septum perforation is most likely due to her septoplasty. However, it is best for her to follow up with her ENT specialist as this may not be a new finding. Asymptomatic at this time. Counseled on appropriate use of nasal spray.           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    2023 - mild white coat syndrome, SBP 110-130 at home, most 110-120)      Multiple allergies 03/26/2011    Acid reflux 03/26/2011         Review of Systems   Constitutional: Negative.   HENT: Negative.     Eyes: Negative.    Cardiovascular: Negative.    Respiratory: Negative.     Endocrine: Negative.    Hematologic/Lymphatic: Negative.    Skin: Negative.    Musculoskeletal: Negative.    Gastrointestinal: Negative.    Genitourinary: Negative.    Neurological: Negative.    Psychiatric/Behavioral: Negative.     Allergic/Immunologic: Negative.        Physical Exam    Physical Exam   General Appearance: no distress   Skin: warm, no ulcers or xanthomas   Digits and Nails: no cyanosis or clubbing   Eyes: conjunctivae and lids normal, pupils are equal and round   Teeth/Gums/Palate: dentition unremarkable, no lesions   Lips & Oral Mucosa: no pallor or cyanosis   Neck Veins: normal JVP , neck veins are not distended   Thyroid: no nodules, masses, tenderness or enlargement   Chest Inspection: chest is normal in appearance   Respiratory Effort: breathing comfortably, no respiratory distress   Auscultation/Percussion: lungs clear to auscultation, no rales or rhonchi, no wheezing   PMI: PMI not enlarged or displaced   Cardiac Rhythm: regular rhythm and normal rate   Cardiac Auscultation: S1, S2 normal, no rub, no gallop   Murmurs: no murmur   Peripheral Circulation: normal peripheral circulation   Carotid Arteries: normal carotid upstroke bilaterally, no bruits   Radial Arteries: normal symmetric radial pulses   Abdominal Aorta: no abdominal aortic bruit   Pedal Pulses: normal symmetric pedal pulses   Lower Extremity Edema: no lower extremity edema   Abdominal Exam: soft, non-tender, no masses, bowel sounds normal   Liver & Spleen: no organomegaly   Gait & Station: walks without assistance  Muscle Strength: normal muscle tone   Orientation: oriented to time, place and person   Affect & Mood: appropriate and sustained affect   Language and Memory: patient responsive and seems to comprehend information   Neurologic Exam: neurological assessment grossly intact   Other: moves all extremities      Cardiovascular Health Factors  Vitals BP Readings from Last 3 Encounters:   01/15/23 128/83   01/01/23 (!) 149/81   10/18/22 135/70     Wt Readings from Last 3 Encounters:   01/15/23 97.1 kg (214 lb)   01/01/23 97.1 kg (214 lb)   10/18/22 90.7 kg (200 lb)     BMI Readings from Last 3 Encounters:   01/15/23 31.60 kg/m?   01/01/23 32.55 kg/m?   10/18/22 30.41 kg/m?      Smoking Social History     Tobacco Use   Smoking Status Former    Current packs/day: 2.00    Average packs/day: 2.0 packs/day for 15.0 years (30.0 ttl pk-yrs)    Types: Cigarettes, Pipe, Cigars    Passive exposure: Past   Smokeless Tobacco Never   Tobacco Comments    Both parents smoked while i lived at home      Lipid Profile Cholesterol   Date Value Ref Range Status   10/18/2022 216 (H) <200 MG/DL Final     HDL   Date Value Ref Range Status   10/18/2022 91 >40 MG/DL Final     LDL   Date Value Ref Range Status   10/18/2022 102 (H) <100 mg/dL Final     Triglycerides   Date Value Ref Range Status   10/18/2022 74 <150 MG/DL Final      Blood Sugar No results found for: HGBA1C  Glucose   Date Value Ref Range Status   10/18/2022 85 70 - 100 MG/DL Final   16/11/9602 540 (H) 70 - 100 MG/DL Final   98/12/9145 85 70 - 100 MG/DL Final          Problems Addressed Today  Encounter Diagnoses   Name Primary?    Primary hypertension Yes    PSVT (paroxysmal supraventricular tachycardia) (HCC)     Mixed hyperlipidemia        Assessment and Plan       HTN (hypertension)  Propranolol 60 BID is the only BP-lowering medication.    Most recent cardiac imaging was a stress echo in 2019 that looked normal.    PSVT (paroxysmal supraventricular tachycardia) (HCC)  She's been on propranolol for around 10 years and hasn't had any recurrence of sustained tachycardia.    Hyperlipidemia  Lab Results   Component Value Date    CHOL 216 (H) 10/18/2022    TRIG 74 10/18/2022    HDL 91 10/18/2022    LDL 102 (H) 10/18/2022    VLDL 15 10/18/2022    NONHDLCHOL 125 10/18/2022    CHOLHDLC 2 09/16/2018      Her coronary calcium score was only 1 in January, 2024, so I've told her that I think LDL < 100 on rosuva 20 mg/day is reasonable.    Current Medications (including today's revisions)   ALPRAZolam (XANAX) 0.25 mg tablet Take one tablet by mouth at bedtime as needed.    aspirin 81 mg chewable tablet Chew one tablet by mouth daily.      azelastine (ASTELIN) 137 mcg (0.1 %) nasal spray Apply one spray to each nostril as directed at bedtime daily.    COQ10 (UBIQUINOL) PO Take 400 mg  by mouth daily.    FLAXSEED OIL PO Take 1,200 mg by mouth daily.      ipratropium bromide (ATROVENT) 42 mcg (0.06 %) nasal spray Apply one spray to two sprays to each nostril as directed four times daily as needed.    Lactobacillus acidophilus (PROBIOTIC PO) Take 1 capsule by mouth daily.    levothyroxine (SYNTHROID) 25 mcg tablet TAKE 1 TABLET BY MOUTH EVERY DAY    loratadine (CLARITIN) 10 mg tablet Take one tablet by mouth every morning.    magnesium citrate 125 mg cap Take one capsule by mouth twice daily.    multivitamin/iron/folic acid (CENTRUM WOMEN PO) Take 1 tablet by mouth daily.    nortriptyline (PAMELOR) 75 mg capsule Take one capsule by mouth at bedtime daily.    pantoprazole DR (PROTONIX) 40 mg tablet Take one tablet by mouth daily.    polypodium leucoto/niacinamide (HELIOCARE ADVANCED PO) Take 2 capsules by mouth daily.    propranolol LA (INDERAL LA) 60 mg capsule Take one capsule by mouth twice daily.    rosuvastatin (CRESTOR) 20 mg tablet Take one tablet by mouth every M-W-F    topiramate (TOPAMAX) 25 mg tablet TAKE 1 TAB ONCE A DAY FOR 1 WEEK, THEN 1 TAB TWICE A DAY FOR 1 WEEK, THEN MAY TAKE 1 TAB IN THE MORNING AND 2 AT BEDTIME FOR 1 WEEK, THEN MAY TAKE 2 TABS TWICE A DAY INDICATIONS: MIGRAINE PREVENTION    traZODone (DESYREL) 150 mg tablet Take one tablet by mouth at bedtime as needed.     Total time spent on today's office visit was 30 minutes.  This includes face-to-face in person visit with patient as well as nonface-to-face time including review of the EMR, outside records, labs, radiologic studies, echocardiogram & other cardiovascular studies, formation of treatment plan, after visit summary, future disposition, and lastly on documentation.

## 2023-02-18 ENCOUNTER — Encounter
Admit: 2023-02-18 | Discharge: 2023-02-18 | Payer: MEDICARE | Primary: Student in an Organized Health Care Education/Training Program

## 2023-02-28 ENCOUNTER — Encounter
Admit: 2023-02-28 | Discharge: 2023-02-28 | Payer: MEDICARE | Primary: Student in an Organized Health Care Education/Training Program

## 2023-03-04 NOTE — Progress Notes
Date of Service: 03/07/2023    Jasmine Lynch is a 68 y.o. female.  DOB: 10-24-1955  MRN: [id number] who presents to [facility] to establish care. Patient is     Prior PCP: Dr. Windle Guard  Reason for change: PCP no longer seeing patients in clinic    Chief Concern:   Chief Complaint   Patient presents with    Follow Up    Nasal Congestion     X6 days     Cough     X 6 days     Other     GAD 5, PHQ 9- 6          HPI:          Patient Reported Other  What topic(s) would you like to cover during your appointment?:  Establish care  Please describe the issue(s) and history with the issue (location, severity, duration, symptoms, etc.).:  N/a  What has been done so far to take care of the issue(s)?:  N/a  What are your goals for this visit?:  Establish care    NP/Establish Care  - Prev seen by Dr. 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   - Follows with Cardiology Barry Dienes)   - Mother had a history of a fib, brother has a history of a fib   - Patient reports it was never determined if it was a fib or PSVT definitively   - Just taking baby ASA, states she has never been on blood thinners   - No further episodes, states this was isolated to a period of stress when caring for her parents    CKD?  - Renal artery duplex and renal bladder complete  - Will repeat labs today     Sinus/Cough  - Reports last Friday symptoms started with sneezing and then progressed to a cough  - Reports the cough was productive, but she could never fully produce sputum  - Denies fevers/chills, n/v/d  - Cough is there every once in awhile, but now feels like sinuses are blocked and having facial pain  - Done saline rinses that have produced yellow phlegm   - Has been taking Mucinex DM   - Uses Astelin every day, takes Atrovent and Claritin prn   - Reports she used to get sinus infections Q3 mo, but feels like she has one now and Augmentin has worked for her in the past     HLD  - Taking Crestor 3x weekly, Zwahlen increased to 3x per week at that time    Polypharmacy  - Does not take Xanax regularly   - Taking Nortrptyline 75 at bedtime regularly  - Trazodone taking as needed 2-3 times per week     HM  - Mood: GAD7 =5, Phq9 = 6   - States she follows with Neurology here who manages her Topiramate, Nortriptyline, etc, wants to leave all of these alone   - Declines psychology   - Mammogram UTD  - Colonoscopy needs to be repeated in 2031   - RSV and COVID: plans to get at pharmacy in [place]   -  DEXA: normal 04/2020      Patient  has a past medical history of Acid reflux (1990), Allergy (1980), Anosmia (05/10/2021), Anxiety (04/10/2011), Arrhythmia, Atrial fibrillation (HCC), Back pain (2015), Cardiac dysrhythmia, Cataract (2020), Chronic cough (09/02/2022), 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      Review of Systems  As per HPI    Past Medical History:    Acid reflux    Allergy    Anosmia    Anxiety    Arrhythmia    Atrial fibrillation (HCC)    Back pain    Cardiac dysrhythmia    Cataract    Chronic cough    Chronic idiopathic facial pain    Chronic sinusitis    Dyspnea    Hay fever    Heart palpitations    HTN (hypertension)    Hyperlipidemia    Hyperthyroidism    Hypothyroidism (acquired)    Leukocytopenia, unspecified    Migraine with aura and without status migrainosus, not intractable    Non-seasonal allergic rhinitis due to pollen    Postmenopausal    Recurrent infections    Sinus infection    Slow transit constipation    Stage 3a chronic kidney disease (HCC)    Vision decreased     Surgical History:   Procedure Laterality Date    HX OOPHORECTOMY Bilateral 1993    COLONOSCOPY DIAGNOSTIC WITH SPECIMEN COLLECTION BY BRUSHING/ WASHING - FLEXIBLE N/A 06/20/2022    Performed by Jolee Ewing, MD at [facility] OR    CARDIOVASCULAR STRESS TEST      COLONOSCOPY  2009?    DOPPLER ECHOCARDIOGRAPHY      ELECTROCARDIOGRAM      EVENT MONITOR      HX HYSTERECTOMY  1993    HX TONSILLECTOMY  1960    SINUS SURGERY  2008?     Family History   Problem Relation Name Age of Onset    Alzheimer's Mother Beth     Dementia Mother Beth     Hypertension Father Gene     Kidney Disease Father Gene     Back pain Father Gene     Stroke Brother Phil     Heart problem Brother Michele Mcalpine     Stroke Paternal Aunt Sudan      Social History     Socioeconomic History    Marital status: Married   Tobacco Use    Smoking status: Former     Current packs/day: 2.00     Average packs/day: 2.0 packs/day for 15.0 years (30.0 ttl pk-yrs)     Types: Cigarettes, Pipe, Cigars     Passive exposure: Past    Smokeless tobacco: Never    Tobacco comments:     Both parents smoked while i lived at home   Vaping Use    Vaping status: Never Used   Substance and Sexual Activity    Alcohol use: Not Currently     Comment: very rarely    Drug use: Never    Sexual activity: Not Currently     Partners: Male     Birth control/protection: None       Objective:  ALPRAZolam (XANAX) 0.25 mg tablet Take one tablet by mouth at bedtime as needed.    amoxicillin-potassium clavulanate (AUGMENTIN) 875/125 mg tablet Take one tablet by mouth twice daily with meals for 7 days. Indications: upper respiratory infection    aspirin 81 mg chewable tablet Chew one tablet by mouth daily.      azelastine (ASTELIN) 137 mcg (0.1 %) nasal spray Apply one spray to each nostril as directed at bedtime daily.    COQ10 (UBIQUINOL) PO Take 400 mg by mouth daily.    FLAXSEED OIL PO Take 1,200 mg by mouth daily.      guaiFENesin LA (MUCINEX) 600 mg tablet Take two tablets by mouth twice daily.    ipratropium bromide (ATROVENT) 42 mcg (0.06 %) nasal spray Apply one spray to two sprays to each nostril as directed four times daily as needed.    Lactobacillus acidophilus (PROBIOTIC PO) Take 1 capsule by mouth daily.    levothyroxine (SYNTHROID) 25 mcg tablet TAKE 1 TABLET BY MOUTH EVERY DAY    loratadine (CLARITIN) 10 mg tablet Take one tablet by mouth every morning.    magnesium citrate 125 mg cap Take one capsule by mouth twice daily.    multivitamin/iron/folic acid (CENTRUM WOMEN PO) Take 1 tablet by mouth daily.    nortriptyline (PAMELOR) 75 mg capsule Take one capsule by mouth at bedtime daily.    pantoprazole DR (PROTONIX) 40 mg tablet Take one tablet by mouth daily.    polypodium leucoto/niacinamide (HELIOCARE ADVANCED PO) Take 2 capsules by mouth daily.    propranolol LA (INDERAL LA) 60 mg capsule Take one capsule by mouth twice daily.    rosuvastatin (CRESTOR) 20 mg tablet Take one tablet by mouth every M-W-F    topiramate (TOPAMAX) 25 mg tablet TAKE 1 TAB ONCE A DAY FOR 1 WEEK, THEN 1 TAB TWICE A DAY FOR 1 WEEK, THEN MAY TAKE 1 TAB IN THE MORNING AND 2 AT BEDTIME FOR 1 WEEK, THEN MAY TAKE 2 TABS TWICE A DAY INDICATIONS: MIGRAINE PREVENTION    traZODone (DESYREL) 150 mg tablet Take one tablet by mouth at bedtime as needed.     Vitals:    03/07/23 1304   BP: 111/63   BP Source: Leg, Left Upper   Pulse: 76   Temp: 36.8 ?C (98.2 ?F)   SpO2: 98%   TempSrc: Skin   PainSc: Four   Weight: 99 kg (218 lb 3.2 oz)     Body mass index is 32.22 kg/m?Marland Kitchen     Labwork reviewed:  No visits with results within 31 Day(s) from this visit.   Latest known visit with results is:   Appointment on 10/18/2022   Component Date Value Ref Range Status    Sodium 10/18/2022 139  137 - 147 MMOL/L Final    Potassium 10/18/2022 3.9  3.5 - 5.1 MMOL/L Final    Chloride 10/18/2022 104  98 - 110 MMOL/L Final    Glucose 10/18/2022 85  70 - 100 MG/DL Final    Blood Urea Nitrogen 10/18/2022 24  7 - 25 MG/DL Final    Creatinine 45/40/9811 1.09 (H)  0.4 - 1.00 MG/DL Final    Calcium 91/47/8295 9.8  8.5 - 10.6 MG/DL Final    Total Protein 10/18/2022 7.1  6.0 - 8.0 G/DL Final    Total Bilirubin 10/18/2022 0.4  0.2 - 1.3 MG/DL Final    Albumin 62/13/0865 4.2  3.5 - 5.0 G/DL Final    Alk Phosphatase 10/18/2022 59  25 -  110 U/L Final    AST (SGOT) 10/18/2022 20  7 - 40 U/L Final    CO2 10/18/2022 25  21 - 30 MMOL/L Final    ALT (SGPT) 10/18/2022 19  7 - 56 U/L Final    Anion Gap 10/18/2022 10  3 - 12 Final    eGFR 10/18/2022 56 (L)  >60 mL/min Final    eGFR calculated using the CKD-EPIcr_R equation    Cholesterol 10/18/2022 216 (H)  <200 MG/DL Final    Triglycerides 10/18/2022 74  <150 MG/DL Final    HDL 19/14/7829 91  >40 MG/DL Final    LDL 56/21/3086 102 (H)  <100 mg/dL Final    VLDL 57/84/6962 15  MG/DL Final    Non HDL Cholesterol 10/18/2022 125  MG/DL Final    Comment: Calculated non-HDL Cholesterol (non-HDL-C) indirectly measures LDL-C, Lp(a),   IDL-C, and VLDL-C.  It is a surrogate marker for Apoprotein B.  Goal should be   less than 130 mg/dL.      White Blood Cells 10/18/2022 4.4 (L)  4.5 - 11.0 K/UL Final    RBC 10/18/2022 3.96 (L)  4.0 - 5.0 M/UL Final    Hemoglobin 10/18/2022 12.3  12.0 - 15.0 GM/DL Final    Hematocrit 95/28/4132 37.0  36 - 45 % Final    MCV 10/18/2022 93.6  80 - 100 FL Final    MCH 10/18/2022 31.1  26 - 34 PG Final    MCHC 10/18/2022 33.2  32.0 - 36.0 G/DL Final    RDW 44/02/270 14.2  11 - 15 % Final    Platelet Count 10/18/2022 269  150 - 400 K/UL Final    MPV 10/18/2022 8.7  7 - 11 FL Final          MAMMO SCREEN BILAT/TOMO (OPEN SCHED)  Narrative: [facility]  [place]  Imaging, Mammography: [facility]  [address] [place] [address]  [phone]    EXAM:  MAMMO SCREEN BILAT/TOMO (OPEN SCHED) 01/01/23  2:25 PM     INDICATION:   Screening    COMPARISON:  Compared to:   10/19/2019 MAMMO SCREEN EXTERNAL IMAGING  10/20/2020 MAMMO SCREEN EXTERNAL IMAGING  11/10/2021 MAMMO SCREEN BILAT/TOMO (OPEN SCHED)     BREAST COMPOSITION:   The breasts are heterogeneously dense, which may obscure small masses.    Your mammogram shows that you have dense breast tissue. This is a normal   finding. However, dense breast tissue makes cancer more difficult to find   on mammogram and increases risk for breast cancer.  Supplemental screening   exams may find additional cancers in dense breast tissue. We offer   ultrasound, abbreviated MRI, or contrast-enhanced mammogram for patients   with dense breast tissue to supplement annual screening mammogram. MRI and   contrast-enhanced mammogram require placement of an IV to give contrast.    To schedule one of these exams, please contact your doctor's office for an   order, then call [phone] to schedule.    TECHNIQUE:  3-D (digital tomosynthesis) and synthetic 2-D images were obtained   bilaterally.    FINDINGS:  No suspicious abnormality is seen.  Impression: :    ASSESSMENT:  Overall: 1 - Negative     RECOMMENDATION AND DUE DATE:  Routine Screening Mammogram in 1 Year - Bilateral  01/03/2024    Electronically signed and approved by: Areta Haber, MD 01/01/2023 2:54  PM     By my electronic signature, I attest that I have personally reviewed the   images for this examination and formulated the interpretations and   opinions expressed in this report        Physical Exam  Constitutional:       General: She is not in acute distress.     Appearance: Normal appearance.   HENT:      Head: Normocephalic and atraumatic.      Right Ear: Tympanic membrane normal.      Left Ear: Tympanic membrane normal. There is no impacted cerumen.      Nose: Congestion present.      Right Sinus: Maxillary sinus tenderness and frontal sinus tenderness present.      Left Sinus: Maxillary sinus tenderness and frontal sinus tenderness present.      Mouth/Throat:      Mouth: Mucous membranes are moist.      Pharynx: No oropharyngeal exudate.   Cardiovascular:      Rate and Rhythm: Normal rate and regular rhythm.      Pulses: Normal pulses.      Heart sounds: Normal heart sounds.   Pulmonary:      Effort: Pulmonary effort is normal.      Breath sounds: Normal breath sounds.   Abdominal:      General: Abdomen is flat. Bowel sounds are normal.      Palpations: Abdomen is soft.   Musculoskeletal:      Right lower leg: No edema.      Left lower leg: No edema.   Skin:     General: Skin is warm and dry.      Capillary Refill: Capillary refill takes less than 2 seconds.   Neurological:      General: No focal deficit present.      Mental Status: She is alert and oriented to person, place, and time.   Psychiatric:         Mood and Affect: Mood normal.         Behavior: Behavior normal.                       Patient Health Questionnaire (PHQ)-2:   Little interest or pleasure in doing things: Several days  Feeling down, depressed or hopeless: Several days    Patient Health Questionnaire (PHQ)-9:   Trouble falling asleep, staying asleep, or sleeping too much: Not at all  Feeling tired or having little energy: Several days  Poor appetite or overeating: More than half the days  Feeling bad about yourself - or that you're a failure or have let  yourself or your family down: Not at all  Trouble concentrating on things, such as reading the newspaper or watching television: Several days  Moving or speaking so slowly that other people could have noticed. Or, the opposite - being so fidgety or restless that you have been moving around a lot more than usual: Not at all  Thoughts that you would be better off dead or of hurting yourself in some way: Not at all    Patient Scores:  PHQ-2 Score: 2  PHQ-9 Score: 6         Assessment and Plan:  Problem List Items Addressed This Visit          CARDIAC AND VASCULATURE    Hyperlipidemia     Calcium score 02/2022 was 1 per Cardio notes  Taking Crestor 3x weekly    -  Repeat lipid panel and adjust accordingly         Relevant Orders    LIPID PROFILE    PSVT (paroxysmal supraventricular tachycardia) (HCC)     Follows with  Cardio [employer])   Stable on propanolol w/o symptoms    - Continue above plan  - Message sent to [facility] to clarify PSVT vs a fib due to a fib being located on patient's chart, awaiting response             ENDOCRINE AND METABOLIC    Hypothyroidism (acquired) - Primary     - Repeat TSH and adjust accordingly         Relevant Orders    TSH WITH FREE T4 REFLEX       ENT    Acute frontal sinusitis     Hx of chronic sinus infections, although it has been awhile since her last  Symptoms consistent with sinus infection, ttp on frontal and maxillary sinuses     - Augmentin reportedly worked well in past, rx sent for 7 days. Cautioned to eat with food. Cautioned regarding diarrhea  - Notify if symptoms do not improve including congestion/cough  - Pt/attending agree with above plan  - Continue plan outlined by Allergy specialist            GENITOURINARY AND REPRODUCTIVE    Stage 3a chronic kidney disease (HCC)     - Reviewed prior imaging by Dr. Windle Guard  - Repeat lab ordered  - Avoid nephrotoxic agents            HEMATOLOGY AND NEOPLASIA    Leukopenia     - Repeat CBC         Relevant Orders    CBC AND DIFF       MENTAL HEALTH    Anxiety     GAD7 = 5 and PHQ9 = 6 today  Feels this is related to the election     - Patient reports stability on Topamax and Nortriptyline, which are managed by her Neurologist  - She would like to continue above medications without changes  - Offered therapy, patient declined at this time but will notify if she changes her mind  - Coping techniques discussed, pt is going to try reading before bed as opposed to watching media         Relevant Orders    COMPREHENSIVE METABOLIC PANEL       NEURO    Migraine with aura and without status migrainosus, not intractable     - Continue Neurology follow up, stable on current regimen              Patient Instructions   It was a pleasure seeing you today. Thank you for trusting [place] with your care.  Please remember to:  - Get labs drawn when you are able; I will let you know the results  - Complete course of Augmentin. If it does not get better (cough or sinuses), please call and let us know.  - I will reach out to your Cardiologist about the PSVT vs A fib   - It was wonderful to meet you!    If you have any questions or concerns going forward, please do not hesitate to let me know.  Justus Memory, DO              Return in about 6 months (around 09/04/2023).        Justus Memory, DO  March 07, 2023

## 2023-03-07 ENCOUNTER — Encounter
Admit: 2023-03-07 | Discharge: 2023-03-07 | Payer: MEDICARE | Primary: Student in an Organized Health Care Education/Training Program

## 2023-03-07 ENCOUNTER — Ambulatory Visit
Admit: 2023-03-07 | Discharge: 2023-03-07 | Payer: MEDICARE | Primary: Student in an Organized Health Care Education/Training Program

## 2023-03-07 DIAGNOSIS — I471 PSVT (paroxysmal supraventricular tachycardia) (HCC): Secondary | ICD-10-CM

## 2023-03-07 DIAGNOSIS — D72819 Decreased white blood cell count, unspecified: Secondary | ICD-10-CM

## 2023-03-07 DIAGNOSIS — E782 Mixed hyperlipidemia: Secondary | ICD-10-CM

## 2023-03-07 DIAGNOSIS — J0110 Acute frontal sinusitis, unspecified: Secondary | ICD-10-CM

## 2023-03-07 DIAGNOSIS — G43109 Migraine with aura, not intractable, without status migrainosus: Secondary | ICD-10-CM

## 2023-03-07 DIAGNOSIS — N1831 Stage 3a chronic kidney disease (HCC): Secondary | ICD-10-CM

## 2023-03-07 MED ORDER — AMOXICILLIN-POT CLAVULANATE 875-125 MG PO TAB
1 | ORAL_TABLET | Freq: Two times a day (BID) | ORAL | 0 refills | 7.00000 days | Status: AC
Start: 2023-03-07 — End: ?

## 2023-03-07 NOTE — Assessment & Plan Note
-   Reviewed prior imaging by Dr. Windle Guard  - Repeat lab ordered  - Avoid nephrotoxic agents

## 2023-03-07 NOTE — Assessment & Plan Note
Hx of chronic sinus infections, although it has been awhile since her last  Symptoms consistent with sinus infection, ttp on frontal and maxillary sinuses     - Augmentin reportedly worked well in past, rx sent for 7 days. Cautioned to eat with food. Cautioned regarding diarrhea  - Notify if symptoms do not improve including congestion/cough  - Pt/attending agree with above plan  - Continue plan outlined by Allergy specialist

## 2023-03-07 NOTE — Assessment & Plan Note
Follows with [facility] Barry Dienes)   Stable on propanolol w/o symptoms    - Continue above plan  - Message sent to [facility] to clarify PSVT vs a fib due to a fib being located on patient's chart, awaiting response

## 2023-03-07 NOTE — Assessment & Plan Note
Calcium score 02/2022 was 1 per Cardio notes  Taking Crestor 3x weekly    - Repeat lipid panel and adjust accordingly

## 2023-03-07 NOTE — Assessment & Plan Note
 Repeat CBC

## 2023-03-07 NOTE — Assessment & Plan Note
-   Continue Neurology follow up, stable on current regimen

## 2023-03-07 NOTE — Assessment & Plan Note
GAD7 = 5 and PHQ9 = 6 today  Feels this is related to the election     - Patient reports stability on Topamax and Nortriptyline, which are managed by her Neurologist  - She would like to continue above medications without changes  - Offered therapy, patient declined at this time but will notify if she changes her mind  - Coping techniques discussed, pt is going to try reading before bed as opposed to watching media

## 2023-03-07 NOTE — Assessment & Plan Note
-   Repeat TSH and adjust accordingly

## 2023-03-08 ENCOUNTER — Encounter
Admit: 2023-03-08 | Discharge: 2023-03-08 | Payer: MEDICARE | Primary: Student in an Organized Health Care Education/Training Program

## 2023-03-08 DIAGNOSIS — N1831 Stage 3a chronic kidney disease (HCC): Secondary | ICD-10-CM

## 2023-03-08 DIAGNOSIS — E039 Hypothyroidism, unspecified: Secondary | ICD-10-CM

## 2023-03-08 DIAGNOSIS — F419 Anxiety disorder, unspecified: Secondary | ICD-10-CM

## 2023-03-11 ENCOUNTER — Encounter
Admit: 2023-03-11 | Discharge: 2023-03-11 | Payer: MEDICARE | Primary: Student in an Organized Health Care Education/Training Program

## 2023-03-14 ENCOUNTER — Encounter
Admit: 2023-03-14 | Discharge: 2023-03-14 | Payer: MEDICARE | Primary: Student in an Organized Health Care Education/Training Program

## 2023-03-14 ENCOUNTER — Ambulatory Visit
Admit: 2023-03-14 | Discharge: 2023-03-15 | Payer: MEDICARE | Primary: Student in an Organized Health Care Education/Training Program

## 2023-03-20 ENCOUNTER — Encounter
Admit: 2023-03-20 | Discharge: 2023-03-20 | Payer: MEDICARE | Primary: Student in an Organized Health Care Education/Training Program

## 2023-03-20 MED ORDER — LEVOTHYROXINE 25 MCG PO TAB
25 ug | ORAL_TABLET | Freq: Every day | ORAL | 1 refills | 30.00000 days | Status: AC
Start: 2023-03-20 — End: ?

## 2023-04-02 ENCOUNTER — Encounter
Admit: 2023-04-02 | Discharge: 2023-04-02 | Payer: MEDICARE | Primary: Student in an Organized Health Care Education/Training Program

## 2023-04-04 ENCOUNTER — Encounter
Admit: 2023-04-04 | Discharge: 2023-04-04 | Payer: MEDICARE | Primary: Student in an Organized Health Care Education/Training Program

## 2023-04-24 ENCOUNTER — Encounter
Admit: 2023-04-24 | Discharge: 2023-04-24 | Payer: MEDICARE | Primary: Student in an Organized Health Care Education/Training Program

## 2023-05-19 ENCOUNTER — Encounter
Admit: 2023-05-19 | Discharge: 2023-05-19 | Payer: MEDICARE | Primary: Student in an Organized Health Care Education/Training Program

## 2023-06-15 ENCOUNTER — Encounter
Admit: 2023-06-15 | Discharge: 2023-06-15 | Payer: MEDICARE | Primary: Student in an Organized Health Care Education/Training Program

## 2023-06-19 ENCOUNTER — Encounter
Admit: 2023-06-19 | Discharge: 2023-06-19 | Payer: MEDICARE | Primary: Student in an Organized Health Care Education/Training Program

## 2023-06-19 MED ORDER — NORTRIPTYLINE 75 MG PO CAP
75 mg | ORAL_CAPSULE | Freq: Every evening | ORAL | 0 refills | 30.00000 days | Status: AC
Start: 2023-06-19 — End: ?

## 2023-06-19 NOTE — Telephone Encounter
 nortriptyline (PAMELOR) 75 mg capsule          Possible duplicate: Hover to review recent actions on this medication    Sig: Take one capsule by mouth at bedtime daily.    Disp: 90 capsule    Refills: 3    Start: 06/19/2023    Class: Normal    Last ordered: 1 year ago (05/07/2022) by Denise R Zwahlen, MD    Psychiatry: Antidepressants - SSRI, TCAs, Serotonin Modulators & SNeRIs Failed05/14/2025 11:58 AM   Protocol Details Patient is < 68 years old. [place] Criteria: Use with caution in patients 65+    Depression Screening completed within the last 6 months    Valid encounter within last 6 months      To be filled at: [id number] - [place], [place] - [address]       Patient requesting refill. Protocol not met. Forwarding for approval or denial of pended medication.

## 2023-06-30 ENCOUNTER — Encounter
Admit: 2023-06-30 | Discharge: 2023-06-30 | Payer: MEDICARE | Primary: Student in an Organized Health Care Education/Training Program

## 2023-07-30 NOTE — Progress Notes
 Date of Service: 08/02/2023    Jasmine Lynch is a 68 y.o. female.  DOB: 28-Feb-1955  MRN: [id number] in clinic for a return visit.  Patient is unaccompanied    Primary concern today is   Chief Complaint   Patient presents with    Follow Up    Cough     X 1 week     Nasal Congestion     X 1 week       HPI:          Answers submitted by the patient for this visit:  Other Symptom Questionnaire (Submitted on 07/26/2023)  Chief Complaint: Patient Reported Other  What topic(s) would you like to cover during your appointment?: Annual wellness check  Please describe the issue(s) and history with the issue (location, severity, duration, symptoms, etc.). : N/a  What has been done so far to take care of the issue(s)?: N/a  What are your goals for this visit?: Continued wellness    Follow Up  - Last seen 03/07/23 to establish care from Dr. Zwahlen  - Follows with Neuro for headaches, attempted to go down on Topiramate  but had recurrence of symptoms, currently taking as prescribed by Neuro, has upcoming appt with her Neurologist in November     Cough/congestion  - Sinus/cough at last visit   - Her husband had a congestion/cough 1 week ago and her husband Augmentin    - Cough is productive   - Denies shortness of breath  - Symptoms started last Friday   - No fevers     Headaches   - Taking 2 Topiramate  2x per day   - A pain clinic doctor one time started Nortriptyline    - Has not been taking Trazodone , but has 150 at home if needed   - Mood has been fine     HLD  - Currently taking statin 3x weekly     PSVT  - Follows with [facility])  - Confirmed with Cardio at last visit that patient does not have a fib and does not need anticoag  - No further episodes, doing well     Anxiety  - GAD7 and PHQ9 elevated at last visit  - Takes Topamax  and Nortriptyline  (see above)   - Feels mood is completely fine now     HM  - Mood: good PHQ9 = 0, anxiety not a problem currently   - Mammo UTD 12/2022   - Colonoscopy, repeat in 2031 per notes  - DEXA: normal 2022, due in 2027       Patient  has a past medical history of Acid reflux (1990), Allergy (1980), Anosmia (05/10/2021), Anxiety (04/10/2011), Arrhythmia, Atrial fibrillation (CMS-HCC), Back pain (2015), Cardiac dysrhythmia, Cataract (2020), Chronic cough (09/02/2022), 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 in clinic today to follow up on chronic medical conditions and issues above.       Review of Systems  As per HPI    Past Medical History:    Acid reflux    Allergy    Anosmia    Anxiety    Arrhythmia    Atrial fibrillation (CMS-HCC)    Back pain  Cardiac dysrhythmia    Cataract    Chronic cough    Chronic idiopathic facial pain    Chronic sinusitis    Dyspnea    Hay fever    Heart palpitations    HTN (hypertension)    Hyperlipidemia    Hyperthyroidism    Hypothyroidism (acquired)    Leukocytopenia, unspecified    Migraine with aura and without status migrainosus, not intractable    Non-seasonal allergic rhinitis due to pollen    Postmenopausal    Recurrent infections    Sinus infection    Slow transit constipation    Stage 3a chronic kidney disease (CMS-HCC)    Vision decreased     Surgical History:   Procedure Laterality Date    HX OOPHORECTOMY Bilateral 1993    COLONOSCOPY DIAGNOSTIC WITH SPECIMEN COLLECTION BY BRUSHING/ WASHING - FLEXIBLE N/A 06/20/2022    Performed by Ledora Catalina, MD at [facility] OR    CARDIOVASCULAR STRESS TEST COLONOSCOPY  2009?    DOPPLER ECHOCARDIOGRAPHY      ELECTROCARDIOGRAM      EVENT MONITOR      HX HYSTERECTOMY  1993    HX TONSILLECTOMY  1960    SINUS SURGERY  2008?     Social History     Socioeconomic History    Marital status: Married   Tobacco Use    Smoking status: Former     Current packs/day: 2.00     Average packs/day: 2.0 packs/day for 15.0 years (30.0 ttl pk-yrs)     Types: Cigarettes, Pipe, Cigars     Passive exposure: Past    Smokeless tobacco: Never    Tobacco comments:     Both parents smoked while i lived at home   Vaping Use    Vaping status: Never Used   Substance and Sexual Activity    Alcohol use: Not Currently     Comment: very rarely    Drug use: Never    Sexual activity: Not Currently     Partners: Male     Birth control/protection: None       Objective:          amoxicillin -potassium clavulanate (AUGMENTIN ) 875/125 mg tablet Take one tablet by mouth twice daily with meals for 7 days.    aspirin 81 mg chewable tablet Chew one tablet by mouth daily.      azelastine (ASTELIN) 137 mcg (0.1 %) nasal spray Apply one spray to each nostril as directed at bedtime daily.    COQ10 (UBIQUINOL) PO Take 400 mg by mouth daily.    FLAXSEED OIL PO Take 1,200 mg by mouth daily.      fluticasone propionate (FLONASE) 50 mcg/actuation nasal spray, suspension Apply two sprays to each nostril as directed daily.    guaiFENesin LA (MUCINEX) 600 mg tablet Take two tablets by mouth twice daily.    ipratropium bromide  (ATROVENT ) 42 mcg (0.06 %) nasal spray Apply one spray to two sprays to each nostril as directed four times daily as needed.    Lactobacillus acidophilus (PROBIOTIC PO) Take 1 capsule by mouth daily.    levothyroxine  (SYNTHROID ) 25 mcg tablet TAKE 1 TABLET BY MOUTH EVERY DAY    loratadine  (CLARITIN ) 10 mg tablet Take one tablet by mouth every morning.    magnesium citrate 125 mg cap Take one capsule by mouth twice daily.    multivitamin/iron/folic acid (CENTRUM WOMEN PO) Take 1 tablet by mouth daily. [START ON 08/30/2023] nortriptyline  (PAMELOR ) 10 mg capsule Take one capsule by  mouth at bedtime daily for 14 days.    nortriptyline  (PAMELOR ) 25 mg capsule Take two capsules by mouth at bedtime daily for 14 days, THEN one capsule at bedtime daily for 14 days.    pantoprazole DR (PROTONIX) 40 mg tablet Take one tablet by mouth daily.    polypodium leucoto/niacinamide (HELIOCARE ADVANCED PO) Take 2 capsules by mouth daily.    propranolol  LA (INDERAL  LA) 60 mg capsule Take one capsule by mouth twice daily.    rosuvastatin  (CRESTOR ) 20 mg tablet TAKE ONE TABLET BY MOUTH EVERY M-W-F    topiramate  (TOPAMAX ) 25 mg tablet Take two tablets by mouth twice daily. Indications: migraine prevention    traZODone  (DESYREL ) 150 mg tablet Take one-half tablet by mouth at bedtime daily.     Vitals:    08/02/23 0957   BP: 108/65   BP Source: Arm, Left Upper   Pulse: 78   Temp: 36.5 ?C (97.7 ?F)   SpO2: 100%   TempSrc: Skin   PainSc: Zero   Weight: 96.3 kg (212 lb 6.4 oz)   Height: 175.3 cm (5' 9.02)     Body mass index is 31.35 kg/m?SABRA     Labwork reviewed:  No visits with results within 31 Day(s) from this visit.   Latest known visit with results is:   Lab Only on 03/14/2023   Component Date Value Ref Range Status    Color,UA 03/14/2023 Straw   Final    Turbidity,UA 03/14/2023 Clear  Clear Final    Specific Gravity-Urine 03/14/2023 1.006  1.005 - 1.030 Final    pH,UA 03/14/2023 7.0  5.0 - 8.0 Final    Protein,UA 03/14/2023 Negative  Negative Final    Glucose,UA 03/14/2023 Negative  Negative Final    Ketones,UA 03/14/2023 Negative  Negative Final    Bilirubin,UA 03/14/2023 Negative  Negative Final    Blood,UA 03/14/2023 Negative  Negative Final    Urobilinogen,UA 03/14/2023 Normal  Normal Final    Nitrite,UA 03/14/2023 Negative  Negative Final    Leukocytes,UA 03/14/2023 Negative  Negative Final    Criteria for reflex to culture are WBC>10/HPF, positive nitrite, and/or >= 1+ leukocytes.    [facility] 03/14/2023 None  None, 0 - 2  /HPF Final    Criteria for reflex to culture are WBC>10/HPF, positive nitrite, and/or >= 1+ leukocytes.    RBCs,UA 03/14/2023 None  None, 0 - 2  /HPF Final          MAMMO SCREEN BILAT/TOMO (OPEN SCHED)  Narrative: [facility]  [place]  Imaging, Mammography: [facility]  [address] [place] [address]  [phone]    EXAM:  MAMMO SCREEN BILAT/TOMO (OPEN SCHED) 01/01/23  2:25 PM     INDICATION:   Screening    COMPARISON:  Compared to:   10/19/2019 MAMMO SCREEN EXTERNAL IMAGING  10/20/2020 MAMMO SCREEN EXTERNAL IMAGING  11/10/2021 MAMMO SCREEN BILAT/TOMO (OPEN SCHED)     BREAST COMPOSITION:   The breasts are heterogeneously dense, which may obscure small masses.    Your mammogram shows that you have dense breast tissue. This is a normal   finding. However, dense breast tissue makes cancer more difficult to find   on mammogram and increases risk for breast cancer.  Supplemental screening   exams may find additional cancers in dense breast tissue. We offer   ultrasound, abbreviated MRI, or contrast-enhanced mammogram for patients   with dense breast tissue to supplement  annual screening mammogram. MRI and   contrast-enhanced mammogram require placement of an IV to give contrast.    To schedule one of these exams, please contact your doctor's office for an   order, then call [phone] to schedule.    TECHNIQUE:  3-D (digital tomosynthesis) and synthetic 2-D images were obtained   bilaterally.    FINDINGS:  No suspicious abnormality is seen.  Impression: :    ASSESSMENT:  Overall: 1 - Negative     RECOMMENDATION AND DUE DATE:  Routine Screening Mammogram in 1 Year - Bilateral  01/03/2024    Electronically signed and approved by: Rosina LILLETTE Raja, MD 01/01/2023 2:54   PM     By my electronic signature, I attest that I have personally reviewed the   images for this examination and formulated the interpretations and   opinions expressed in this report         Physical Exam  Constitutional:       General: She is not in acute distress.     Appearance: Normal appearance.   HENT:      Head: Normocephalic and atraumatic.      Right Ear: Tympanic membrane normal.      Left Ear: Tympanic membrane normal.      Nose: Nose normal. No congestion.      Mouth/Throat:      Mouth: Mucous membranes are moist.   Cardiovascular:      Rate and Rhythm: Normal rate and regular rhythm.      Pulses: Normal pulses.      Heart sounds: Normal heart sounds.   Pulmonary:      Effort: Pulmonary effort is normal. No respiratory distress.      Breath sounds: Normal breath sounds. No wheezing, rhonchi or rales.   Abdominal:      General: Abdomen is flat. Bowel sounds are normal.      Palpations: Abdomen is soft.   Musculoskeletal:      Right lower leg: No edema.      Left lower leg: No edema.   Skin:     General: Skin is warm and dry.      Capillary Refill: Capillary refill takes less than 2 seconds.   Neurological:      General: No focal deficit present.      Mental Status: She is alert and oriented to person, place, and time.   Psychiatric:         Mood and Affect: Mood normal.         Behavior: Behavior normal.               Assessment and Plan:  Problem List Items Addressed This Visit          CARDIAC AND VASCULATURE    HTN (hypertension)    - Patient follows with Cardiology  - Vital signs stable today  - No changes to current regimen          Hyperlipidemia    Calcium score 02/2022 was 1 per Cardio notes  Taking Crestor  3x weekly     - Repeat lipid panel and adjust accordingly            Relevant Orders    LIPID PROFILE       ENDOCRINE AND METABOLIC    Hypothyroidism (acquired)    TSH stable at last lab check; continue current regimen             ENT    Acute frontal sinusitis -  Primary    Hx of chronic sinus infections  Treated in January 2025 fully resolved  Symptoms started ~ 7 days ago with frontal/maxillary facial pain and cough  Lungs clear on exam    - Will trial Augmentin  x 7 days, confirmed done well with in the past. Cautioned to take with food and cautioned diarrhea  - Advised to notify if symptoms do not improve and CXR could be obtained  - Continue ENT/Allergy follow up             GENITOURINARY AND REPRODUCTIVE    Stage 3a chronic kidney disease (CMS-HCC)    - Repeat lab ordered  - Avoid nephrotoxic agents         Relevant Orders    BASIC METABOLIC PANEL       HEMATOLOGY AND NEOPLASIA    Leukopenia    - Last CBC wnl/resolved            MENTAL HEALTH    Anxiety    PHQ9 = 0 today, no anxiety    - Patient reports she is feeling really well and would like to trial off of the Nortriptyline . States this was started for head pain and not for anxiety/depression in the first place. Will wean Nortriptyline  as ordered:    - 50mg  for 2 weeks   - 25mg  for 2 weeks   - 10mg  for 2 weeks  - Advised patient to keep a close eye on symptoms and call [facility] clinic with any worsening/changes. At that time, will continue lowest tolerated dose.   - Patient is doing well from a sleeping standpoint. She is not requiring Trazodone . Advised if she does need to resume, that she should cut in half. She is agreeable to this.                NEURO    Migraine with aura and without status migrainosus, not intractable    - Continue Neurology follow up  - Trialed a lower dose of Topiramate  at last visit d/t kidney function, patient did not tolerate lower dose and is doing well on 50 BID. Managed by Neurology who is ok with this. Continue specialist follow up            Patient Instructions   It was a pleasure seeing you today. Thank you for trusting us  with your care.  Please remember to:  - We will trial a wean of Nortriptyline    - 50mg  for 2 weeks   - 25mg  for 2 weeks   - 10mg  for 2 weeks  - If at any point you have symptoms of any kind, please call [facility] clinic to let us  know so we can slow the wean.   - If you need to use Trazodone , I would recommend cutting in half  - We will get labs done today - I have ordered Augmentin . If you do not have complete resolution of symptoms after this, let us  know so we can get a chest x-ray.  If you have any questions or concerns going forward, please do not hesitate to let me know.  Gwendlyn Derby, DO         Patient Health Questionnaire (PHQ)-2:   Little interest or pleasure in doing things: Not at all  Feeling down, depressed or hopeless: Not at all    Patient Health Questionnaire (PHQ)-9:   Trouble falling asleep, staying asleep, or sleeping too much: Not at all  Feeling tired or having little energy: Not at all  Poor appetite or overeating: Not at all  Feeling bad about yourself - or that you're a failure or have let  yourself or your family down: Not at all  Trouble concentrating on things, such as reading the newspaper or watching television: Not at all  Moving or speaking so slowly that other people could have noticed. Or, the opposite - being so fidgety or restless that you have been moving around a lot more than usual: Not at all  Thoughts that you would be better off dead or of hurting yourself in some way: Not at all    Patient Scores:  PHQ-2 Score: 0  PHQ-9 Score: 0       Return in about 3 months (around 11/02/2023) for follow up Dr. Rumalda .      Gwendlyn Derby, DO  August 02, 2023    Discussed w/ attending, Dr. Alfredo, who agrees with above plan.

## 2023-08-01 NOTE — Assessment & Plan Note
-   Repeat lab ordered  - Avoid nephrotoxic agents

## 2023-08-01 NOTE — Assessment & Plan Note
Last CBC wnl

## 2023-08-01 NOTE — Assessment & Plan Note
 TSH stable at last lab check; continue current regimen

## 2023-08-02 ENCOUNTER — Encounter: Admit: 2023-08-02 | Discharge: 2023-08-02 | Payer: MEDICARE

## 2023-08-02 ENCOUNTER — Ambulatory Visit: Admit: 2023-08-02 | Discharge: 2023-08-02 | Payer: MEDICARE

## 2023-08-02 DIAGNOSIS — G43109 Migraine with aura, not intractable, without status migrainosus: Principal | ICD-10-CM

## 2023-08-02 DIAGNOSIS — E039 Hypothyroidism, unspecified: Secondary | ICD-10-CM

## 2023-08-02 DIAGNOSIS — J0110 Acute frontal sinusitis, unspecified: Secondary | ICD-10-CM

## 2023-08-02 DIAGNOSIS — F419 Anxiety disorder, unspecified: Secondary | ICD-10-CM

## 2023-08-02 DIAGNOSIS — I10 Essential (primary) hypertension: Secondary | ICD-10-CM

## 2023-08-02 DIAGNOSIS — D72819 Decreased white blood cell count, unspecified: Secondary | ICD-10-CM

## 2023-08-02 DIAGNOSIS — N1831 Stage 3a chronic kidney disease (CMS-HCC): Secondary | ICD-10-CM

## 2023-08-02 DIAGNOSIS — E782 Mixed hyperlipidemia: Secondary | ICD-10-CM

## 2023-08-02 MED ORDER — TRAZODONE 150 MG PO TAB
75 mg | Freq: Every evening | ORAL | 0 refills | Status: AC
Start: 2023-08-02 — End: ?

## 2023-08-02 MED ORDER — NORTRIPTYLINE 10 MG PO CAP
10 mg | ORAL_CAPSULE | Freq: Every evening | ORAL | 0 refills | 30.00000 days | Status: AC
Start: 2023-08-02 — End: ?

## 2023-08-02 MED ORDER — NORTRIPTYLINE 25 MG PO CAP
ORAL_CAPSULE | ORAL | 0 refills | 30.00000 days | Status: AC
Start: 2023-08-02 — End: ?

## 2023-08-02 MED ORDER — AMOXICILLIN-POT CLAVULANATE 875-125 MG PO TAB
1 | ORAL_TABLET | Freq: Two times a day (BID) | ORAL | 0 refills | 7.00000 days | Status: AC
Start: 2023-08-02 — End: ?

## 2023-08-02 NOTE — Assessment & Plan Note
-   Patient follows with Cardiology  - Vital signs stable today  - No changes to current regimen

## 2023-08-02 NOTE — Assessment & Plan Note
 Calcium score 02/2022 was 1 per Cardio notes  Taking Crestor 3x weekly    - Repeat lipid panel and adjust accordingly

## 2023-08-02 NOTE — Progress Notes
 I performed a chart review of Jasmine Lynch  and discussed the management with Gwendlyn Derby, DO at the time of the visit.  I agree with the evaluation and plan as documented by this fellow, unless noted below.

## 2023-08-02 NOTE — Assessment & Plan Note
-   Continue Neurology follow up  - Trialed a lower dose of Topiramate  at last visit d/t kidney function, patient did not tolerate lower dose and is doing well on 50 BID. Managed by Neurology who is ok with this. Continue specialist follow up

## 2023-08-03 ENCOUNTER — Encounter: Admit: 2023-08-03 | Discharge: 2023-08-03 | Payer: MEDICARE

## 2023-08-27 ENCOUNTER — Encounter: Admit: 2023-08-27 | Discharge: 2023-08-27 | Payer: MEDICARE

## 2023-08-28 ENCOUNTER — Encounter: Admit: 2023-08-28 | Discharge: 2023-08-28 | Payer: MEDICARE

## 2023-08-28 DIAGNOSIS — R519 Chronic idiopathic facial pain: Principal | ICD-10-CM

## 2023-08-28 MED ORDER — NORTRIPTYLINE 50 MG PO CAP
50 mg | ORAL_CAPSULE | Freq: Every evening | ORAL | 1 refills | 30.00000 days | Status: AC
Start: 2023-08-28 — End: ?

## 2023-09-11 ENCOUNTER — Encounter: Admit: 2023-09-11 | Discharge: 2023-09-11 | Payer: MEDICARE

## 2023-09-11 MED ORDER — LEVOTHYROXINE 25 MCG PO TAB
25 ug | ORAL_TABLET | Freq: Every day | ORAL | 1 refills | 30.00000 days | Status: AC
Start: 2023-09-11 — End: ?

## 2023-09-28 ENCOUNTER — Encounter: Admit: 2023-09-28 | Discharge: 2023-09-28 | Payer: MEDICARE

## 2023-11-12 ENCOUNTER — Encounter: Admit: 2023-11-12 | Discharge: 2023-11-12 | Payer: MEDICARE

## 2023-11-12 MED ORDER — LORATADINE 10 MG PO TAB
10 mg | ORAL_TABLET | Freq: Every morning | ORAL | 0 refills | 28.00000 days | Status: AC
Start: 2023-11-12 — End: ?

## 2023-11-22 ENCOUNTER — Ambulatory Visit: Admit: 2023-11-22 | Discharge: 2023-11-22 | Payer: MEDICARE

## 2023-11-22 ENCOUNTER — Encounter: Admit: 2023-11-22 | Discharge: 2023-11-22 | Payer: MEDICARE

## 2023-11-22 VITALS — BP 133/81 | HR 82 | Temp 97.60000°F | Resp 20 | Ht 69.02 in | Wt 182.0 lb

## 2023-11-22 DIAGNOSIS — J0111 Acute recurrent frontal sinusitis: Secondary | ICD-10-CM

## 2023-11-22 DIAGNOSIS — E782 Mixed hyperlipidemia: Principal | ICD-10-CM

## 2023-11-22 MED ORDER — AMOXICILLIN-POT CLAVULANATE 875-125 MG PO TAB
1 | ORAL_TABLET | Freq: Two times a day (BID) | ORAL | 0 refills | 7.00000 days | Status: AC
Start: 2023-11-22 — End: ?

## 2023-11-22 NOTE — Progress Notes
 Family Medicine Clinic Visit Note     Encounter Date: 11/22/2023   Patient Name: Jasmine Lynch   Date of Birth: [birth year]   MRN: [id number]   Encounter Provider Erlinda VEAR Ee, MD   Primary Care Physician:  Bennye Nix H, MD (General)     SUBJECTIVE     Chief Complaint   Patient presents with    Follow Up       History of Present Illness     Jasmine Lynch presents with two main concerns: a request to reassess her current Crestor  dosing regimen and symptoms suggestive of a sinus infection.    Regarding her cholesterol management, she reports that Dr. Tollie had recently increased her Crestor  dose to four days a week. Her last cholesterol check was in June, and she has not had follow-up testing since the dose adjustment. She is hoping her cholesterol levels are low enough to potentially reduce the frequency of her statin therapy. She denies experiencing side effects to the medication. She has a family history of cardiac issues, with her mother having atrial fibrillation and supraventricular tachycardia, and her brother having similar conditions.    For her sinus symptoms, she reports experiencing left ear popping and green nasal discharge for approximately one week, along with ear fullness. She states she typically gets sinus infections once or twice a year. She has a history of sinus surgery performed 20 years ago and reports having a deviated septum that was surgically corrected. Despite the previous surgery, she continues to experience sinus infections about three times per year. She denies having any fevers, reporting only drainage. She is currently managing her symptoms with a daily regimen of Astelin nasal spray, Flonase, twice-daily Mucinex, and once-daily Claritin . She uses Ipratroprium nasal spray as needed but notes it tends to dry her out.       OBJECTIVE     Vitals:    11/22/23 1041   BP: 133/81   BP Source: Leg, Left Upper   Pulse: 82   Temp: 36.4 ?C (97.6 ?F)   Resp: 20   SpO2: 100%   PainSc: Zero   Weight: 82.6 kg (182 lb)   Height: 175.3 cm (5' 9.02)     Wt Readings from Last 4 Encounters:   11/22/23 82.6 kg (182 lb)   08/02/23 96.3 kg (212 lb 6.4 oz)   03/07/23 99 kg (218 lb 3.2 oz)   01/15/23 97.1 kg (214 lb)     Body mass index is 26.86 kg/m?SABRA    Medications      amoxicillin -potassium clavulanate (AUGMENTIN ) 875/125 mg tablet Take one tablet by mouth twice daily with meals for 5 days.    aspirin 81 mg chewable tablet Chew one tablet by mouth daily.      azelastine (ASTELIN) 137 mcg (0.1 %) nasal spray Apply one spray to each nostril as directed at bedtime daily.    COQ10 (UBIQUINOL) PO Take 400 mg by mouth daily.    FLAXSEED OIL PO Take 1,200 mg by mouth daily.      fluticasone propionate (FLONASE) 50 mcg/actuation nasal spray, suspension Apply two sprays to each nostril as directed daily.    guaiFENesin LA (MUCINEX) 600 mg tablet Take two tablets by mouth twice daily.    ipratropium bromide  (ATROVENT ) 42 mcg (0.06 %) nasal spray Apply one spray to two sprays to each nostril as directed four times daily as needed.    Lactobacillus acidophilus (PROBIOTIC PO) Take 1 capsule by mouth daily.    levothyroxine  (  SYNTHROID ) 25 mcg tablet TAKE 1 TABLET BY MOUTH EVERY DAY    loratadine  (CLARITIN ) 10 mg tablet TAKE 1 TABLET BY MOUTH EVERY DAY IN THE MORNING    magnesium citrate 125 mg cap Take one capsule by mouth twice daily.    multivitamin/iron/folic acid (CENTRUM WOMEN PO) Take 1 tablet by mouth daily.    nortriptyline  (PAMELOR ) 50 mg capsule Take one capsule by mouth at bedtime daily.    pantoprazole DR (PROTONIX) 40 mg tablet Take one tablet by mouth daily.    polypodium leucoto/niacinamide (HELIOCARE ADVANCED PO) Take 2 capsules by mouth daily.    propranolol  LA (INDERAL  LA) 60 mg capsule Take one capsule by mouth twice daily.    rosuvastatin  (CRESTOR ) 20 mg tablet TAKE ONE TABLET BY MOUTH EVERY M-W-F    topiramate  (TOPAMAX ) 25 mg tablet Take two tablets by mouth twice daily. Indications: migraine prevention traZODone  (DESYREL ) 150 mg tablet TAKE 1 TABLET BY MOUTH EVERY DAY AT BEDTIME AS NEEDED       Physical Exam     Physical Exam  Constitutional:       Appearance: Normal appearance.   HENT:      Head: Normocephalic and atraumatic.      Right Ear: Hearing, tympanic membrane, ear canal and external ear normal.      Left Ear: Hearing, tympanic membrane, ear canal and external ear normal.      Nose: Nose normal.      Mouth/Throat:      Lips: Pink.      Mouth: Mucous membranes are moist.      Dentition: Normal dentition.      Pharynx: Oropharynx is clear. Uvula midline.      Tonsils: No tonsillar exudate or tonsillar abscesses.   Cardiovascular:      Rate and Rhythm: Normal rate and regular rhythm.      Heart sounds: No murmur heard.     No gallop.   Pulmonary:      Effort: Pulmonary effort is normal. No respiratory distress.      Breath sounds: No wheezing or rhonchi.   Skin:     Findings: No rash.   Neurological:      General: No focal deficit present.      Mental Status: She is alert. Mental status is at baseline.   Psychiatric:         Mood and Affect: Mood normal.         Behavior: Behavior normal.           ASSESSMENT & PLAN     Jasmine Lynch  is a 68 y.o. presenting for sinusitis, lipid follow up.    1. Mixed hyperlipidemia (Primary)  - LIPID PROFILE; Future  - COMPREHENSIVE METABOLIC PANEL; Future    2. Acute recurrent frontal sinusitis  - amoxicillin -potassium clavulanate (AUGMENTIN ) 875/125 mg tablet; Take one tablet by mouth twice daily with meals for 5 days.  Dispense: 10 tablet; Refill: 0       Acute sinusitis  Jasmine Lynch presents with classic symptoms of acute bacterial sinusitis including left ear fullness and popping for approximately one week, accompanied by green nasal discharge without fever. Jasmine Lynch has a history of recurrent sinus infections occurring 2-3 times annually despite previous sinus surgery 20 years ago for deviated septum repair. Current symptoms have persisted for at least one week duration, making bacterial etiology likely and warranting antibiotic treatment.  Plan:  - Prescribe Augmentin  for 5 days    Hyperlipidemia management  Jasmine Lynch currently  on Crestor  20 mg four days weekly. Last lipid panel approximately 4 months ago showed excellent results. Jasmine Lynch requests consideration for dose reduction. Family history significant for maternal atrial fibrillation and SVT, with brother having similar cardiac issues. Calcium score performed early 2024 was reviewed with absence of coronary calcification.   Plan:  - Order lipid panel and comprehensive metabolic panel to assess current cholesterol levels and monitor for statin-related effects on liver and kidney function  - Discussed that statins are generally safe long-term with muscle soreness being most common side effect, which Jasmine Lynch has not experienced  - Counseled that despite good calcium score, long-term low-dose statin therapy may provide cardiovascular protection over extended period  - Alternative discussed: discontinue statin with calcium score recheck in 4 years    Preventive care follow-up  Jasmine Lynch for annual wellness visit as last yearly visit was completed some time ago. Recent vaccinations up to date with COVID and flu shots received in September and October.  Plan:  - Schedule follow-up in 3 months for annual wellness visit  - Vaccination review to be completed at next visit    Return precautions discussed, questions sought and answered. Patient expressed understanding and agreed with treatment plan.     Discussed and seen with Dr. Jones     Abram Sax-Clay, MD (she/her)  PGY-3, Resident Physician  Family Medicine Residency  [facility]

## 2023-11-22 NOTE — Patient Instructions
 Mieke,    It was nice to see you in clinic today.      Routine Clinic Information:    Please don't hesitate to call if you have any problems or questions at [phone].      You may also send me a message in MyChart. Please allow at least 3 business days for refill requests.      Warmest regards,   Dr. Darlyne

## 2023-11-26 ENCOUNTER — Encounter: Admit: 2023-11-26 | Discharge: 2023-11-26 | Payer: MEDICARE

## 2023-12-03 ENCOUNTER — Encounter: Admit: 2023-12-03 | Discharge: 2023-12-03 | Payer: MEDICARE

## 2023-12-03 MED ORDER — PROPRANOLOL 60 MG PO CS24
60 mg | ORAL_CAPSULE | Freq: Two times a day (BID) | ORAL | 3 refills | 30.00000 days | Status: AC
Start: 2023-12-03 — End: ?

## 2023-12-25 ENCOUNTER — Encounter: Admit: 2023-12-25 | Discharge: 2023-12-25 | Payer: MEDICARE

## 2023-12-25 ENCOUNTER — Ambulatory Visit: Admit: 2023-12-25 | Discharge: 2023-12-26 | Payer: MEDICARE

## 2023-12-25 VITALS — BP 125/72 | HR 88 | Temp 97.80000°F | Resp 16 | Ht 69.016 in | Wt 185.0 lb

## 2023-12-25 DIAGNOSIS — G44221 Chronic tension-type headache, intractable: Principal | ICD-10-CM

## 2023-12-25 MED ORDER — TOPIRAMATE 25 MG PO TAB
25 mg | ORAL_TABLET | Freq: Two times a day (BID) | ORAL | 3 refills | 30.00000 days | Status: AC
Start: 2023-12-25 — End: ?

## 2023-12-25 NOTE — Progress Notes [1]
 Date of Service: 12/25/2023    Subjective:             Jasmine Lynch is a 68 y.o. female here for follow up due to headaches.     History of Present Illness  Jasmine Lynch is a 68 y.o. female with a past medical history of atrial fibrillation, chronic sinusitis, HTN, hyperlipidemia and hypothyroidism, here for follow up due to chronic headaches. Patient describes headaches for years, they are usually localized on frontal area, pain described as pressure sensation, headaches usually last for hours, they are not associated with nausea, phonophobia/photophobia; denies aura or visual/autonomic symptoms with them. Menstrual cycles, stress, weather changes and infections may trigger headaches but patient denies new triggers lately. Frequency has been once every 3 months and severity could be up to 5/10. Headaches are overall better lately. Patient denies neck pain, dizziness, balance issues, paresthesias or recent falls. She denies recent mood changes or sleep problems. Most recent eye clinic visit was unremarkable per patient.       Headache medications:  Preventive:   Topiramate  50mg  bid (reports benefit, denies side effects)  Nortriptyline  50mg  qday (reports some benefit for sleep and headaches are not as intense and denies side effects, but noticed more headaches when she tried to stop medication)   Propranolol  LA 60mg  bid (prescribed for heart issues, denies benefit for headaches)  Tried Gabapentin (denies benefit) and Aimovig 140mg  qmonth (denies clear benefit)     Abortive:   Tylenol/Ibuprofen with some benefit (not taking lately), denies taking nausea meds  She denies taking any other over-the-counter medication recently.       Objective:        Objective    aspirin 81 mg chewable tablet Chew one tablet by mouth daily.      azelastine (ASTELIN) 137 mcg (0.1 %) nasal spray Apply one spray to each nostril as directed at bedtime daily.    COQ10 (UBIQUINOL) PO Take 400 mg by mouth daily.    FLAXSEED OIL PO Take 1,200 mg by mouth daily.      fluticasone propionate (FLONASE) 50 mcg/actuation nasal spray, suspension Apply two sprays to each nostril as directed daily.    guaiFENesin LA (MUCINEX) 600 mg tablet Take two tablets by mouth twice daily.    ipratropium bromide  (ATROVENT ) 42 mcg (0.06 %) nasal spray Apply one spray to two sprays to each nostril as directed four times daily as needed.    Lactobacillus acidophilus (PROBIOTIC PO) Take 1 capsule by mouth daily.    levothyroxine  (SYNTHROID ) 25 mcg tablet TAKE 1 TABLET BY MOUTH EVERY DAY    loratadine  (CLARITIN ) 10 mg tablet TAKE 1 TABLET BY MOUTH EVERY DAY IN THE MORNING    magnesium citrate 125 mg cap Take one capsule by mouth twice daily.    multivitamin/iron/folic acid (CENTRUM WOMEN PO) Take 1 tablet by mouth daily.    nortriptyline  (PAMELOR ) 50 mg capsule Take one capsule by mouth at bedtime daily.    pantoprazole DR (PROTONIX) 40 mg tablet Take one tablet by mouth daily.    polypodium leucoto/niacinamide (HELIOCARE ADVANCED PO) Take 2 capsules by mouth daily.    propranolol  LA (INDERAL  LA) 60 mg capsule TAKE 1 CAPSULE BY MOUTH TWICE A DAY    rosuvastatin  (CRESTOR ) 20 mg tablet TAKE ONE TABLET BY MOUTH EVERY M-W-F    topiramate  (TOPAMAX ) 25 mg tablet Take two tablets by mouth twice daily. Indications: migraine prevention    traZODone  (DESYREL ) 150 mg tablet TAKE 1 TABLET  BY MOUTH EVERY DAY AT BEDTIME AS NEEDED     Vitals:    12/25/23 1244   BP: 125/72   BP Source: Arm, Left Upper   Pulse: 88   Temp: 36.6 ?C (97.8 ?F)   Resp: 16   SpO2: 96%  Comment: RA   TempSrc: Temporal   PainSc: Zero   Weight: 83.9 kg (185 lb)   Height: 175.3 cm (5' 9.02)     Body mass index is 27.31 kg/m?SABRA     Physical Exam  GENERAL APPEARANCE: The patient is alert. Patient is in no acute distress, following commands and cooperative. Well developed and well nourished.   HEENT: Normocephalic and atraumatic.     NEUROLOGIC EXAM:   Orientation: The patient is alert and oriented times three. Patient is following commands. Speech is fluent, intact comprehension, repetition and naming.     Cranial nerves:  1st cranial nerve:  not tested   2nd cranial nerve: normal; Visual fields are full to confrontation. Pupils are equal, round, and reactive to light and accommodation.   3rd, 4th & 6th cranial nerves: Extraocular movements are intact without nystagmus.  5th cranial nerve: normal; intact muscles of mastication. Intact light touch and pin prick.  7th cranial nerve: normal; no facial asymmetry  8th cranial nerve: normal  9th & 10th cranial nerves: normal; Gag is present   11th cranial nerve: normal; Shoulder shrug symmetric   12th cranial nerve: normal; tongue is midline.      Strength: (Right/Left) Deltoid 5/5, Biceps 5/5, Triceps 5/5, Finger ext 5/5, interossei 5/5, Hip Flexion 5/5, Knee ext 5/5, Knee flex 5/5, Ankle dorsiflexion 5/5, Ankle plantarflexion 5/5. Normal bulk and tone in all four limbs without any evidence of an arm drift.  No abnormal movements during exam.  Sensory: Intact to pain, temperature and vibration in both upper/lower extremities.   Coordination is intact finger-to-nose.    Deep tendon reflexes are 2+ in biceps, triceps, brachioradialis and patellar bilaterally and 1+ ankle reflex.  Gait is normal based without ataxia.     Assessment and Plan:  Chronic tension/migraines. Patient thinks that headaches have been stable in the last few months, they are not frequent or intense lately. She still reports some benefit and good tolerance with nortriptyline  and Topiramate  use. PCP tried to wean nortriptyline  off but patient noticed more headaches at that time. She denies dizziness, neck pain, balance problems or recent falls. Neurological exam seems stable today. We will try a lower Topiramate  dose since headaches are stable, patient may continue to wean medication off in the future if possible.    Recommendations:  We will keep nortriptyline  for headache prophylaxis.   The patient was advised to decrease amount of Topiramate  gradually to 25mg  bid, she may continue to wean medication off in the future if possible.   Patient will take NSAIDs as abortive medications for headaches.   Follow-up appointment in 12 months.     Total time was 30 minutes. This time was spent preparing to see the patient, obtaining and/or reviewing history, performing an examination, ordering medications, tests/ procedures, communicating results to the patient, documenting clinical information in the electronic health record and counseling/educating the patient regarding headaches.

## 2023-12-27 ENCOUNTER — Encounter: Admit: 2023-12-27 | Discharge: 2023-12-27 | Payer: MEDICARE

## 2023-12-27 MED ORDER — TRAZODONE 150 MG PO TAB
150 mg | ORAL_TABLET | Freq: Every evening | ORAL | 1 refills | 30.00000 days | Status: AC
Start: 2023-12-27 — End: ?

## 2023-12-27 NOTE — Telephone Encounter [36]
Refill sent to pharmacy, via escribe per protocol.

## 2024-01-14 ENCOUNTER — Encounter: Admit: 2024-01-14 | Discharge: 2024-01-14 | Payer: MEDICARE

## 2024-01-14 DIAGNOSIS — F419 Anxiety disorder, unspecified: Principal | ICD-10-CM

## 2024-01-14 MED ORDER — ALPRAZOLAM 0.25 MG PO TAB
.25 mg | ORAL_TABLET | Freq: Three times a day (TID) | ORAL | 0 refills | 30.00000 days | Status: AC | PRN
Start: 2024-01-14 — End: ?

## 2024-01-14 NOTE — Telephone Encounter [36]
 Call placed to patient to acknowledge her message has been received, nurse gave apologies for her loss and advised she would see if provider would prescribe some xanax , nurse confirmed [facility] [place], nurse advised she would call back when nurse has heard back from provider.

## 2024-01-14 NOTE — Telephone Encounter [36]
 New script for xanax   (Newest Message First)             Jasmine Lynch [facility] (supporting Kaitlin H Rose, MD) (Selected Message)        01/14/24  8:21 AM  My husband passed unexpectedly Sunday night & i am having anxiety attacks. My prescription for xanax  is very old. Could i possibly get a new one for short term use only please?

## 2024-01-14 NOTE — Telephone Encounter [36]
 Pt called back from message, caller identity was verified, and information was read back to confirm accuracy and ensure patient safety.    Reason for Conversation  Panic Attack (Red CPI)    Background   Huaband unexpectedly passed away on 03-Mar-2024 night and is having episodes of anxiety attacks. Reports tense, restless, panicky, anxious, keyed up, and overwhelmed. Anxiety level depends on the time and what is going on. Denies having any suicidal thoughts, urges to harm themselves, or any intentions of harming others. Reports a great family support. Has taking Xanax  in the past for anxiety. Education provided on home care, symptoms to monitor, and when to seek emergency treatment. Voices verbal understanding and denies any other needs.     Pt is requesting a order for Xanax  for [facility] term use [facility] [place].     Routing to provider and team for review; please call patient with reply.     Disposition   DISCUSS WITH PCP AND CALLBACK BY NURSE TODAY (overriding Immediate office evaluation)    Reason for Disposition      Patient sounds very upset or troubled to the triager    1. CONCERN: Did anything happen that prompted you to call today?       Huaband unexpectedly passed away on Mar 03, 2024 night and is having episodes of anxiety attacks.   2. ANXIETY SYMPTOMS: Can you describe how you (your loved one; patient) have been feeling? (e.g., tense, restless, panicky, anxious, keyed up, overwhelmed, sense of impending doom).       Reports tense, restless, panicky, anxious, keyed up, and overwhelmed.  3. ONSET: How long have you been feeling this way? (e.g., hours, days, weeks)      03-03-2024, December 7  4. SEVERITY: How would you rate the level of anxiety? (e.g., 0 - 10; or mild, moderate, severe).      Depends on the time and what is going on.   5. FUNCTIONAL IMPAIRMENT: How have these feelings affected your ability to do daily activities? Have you had more difficulty than usual doing your normal daily activities? (e.g., getting better, same, worse; self-care, school, work, interactions)      No.  6. HISTORY: Have you felt this way before? Have you ever been diagnosed with an anxiety problem in the past? (e.g., generalized anxiety disorder, panic attacks, PTSD). If Yes, ask: How was this problem treated? (e.g., medicines, counseling, etc.)      n/a  7. RISK OF HARM - SUICIDAL IDEATION: Do you ever have thoughts of hurting or killing yourself? If Yes, ask:  Do you have these feelings now? Do you have a plan on how you would do this?      Denies having any suicidal thoughts, urges to harm themselves, or any intentions of harming others.   8. TREATMENT:  What has been done so far to treat this anxiety? (e.g., medicines, relaxation strategies). What has helped?      Has taking Xanax  in the past.   9. THERAPIST: Do you have a counselor or therapist? If Yes, ask: What is their name?      n/a  10. POTENTIAL TRIGGERS: Do you drink caffeinated beverages (e.g., coffee, colas, teas), and how much daily? Do you drink alcohol or use any drugs? Have you started any new medicines recently?        N/a  11. PATIENT SUPPORT: Who is with you now? Who do you live with? Do you have family or friends who you can  talk to?         Reports a great family support.   12. OTHER SYMPTOMS: Do you have any other symptoms? (e.g., feeling depressed, trouble concentrating, trouble sleeping, trouble breathing, palpitations or fast heartbeat, chest pain, sweating, nausea, or diarrhea)        No.  13. PREGNANCY: Is there any chance you are pregnant? When was your last menstrual period?        n/a    No Additional Information on file.    Protocols Used     Anxiety and Panic Attack-A-[place]

## 2024-01-14 NOTE — Telephone Encounter [36]
 Call placed to patient, nurse advised [facility] [place] [facility] and she can follow up with them this afternoon. Patient voiced understanding.

## 2024-01-14 NOTE — Telephone Encounter [36]
 ALPRAZolam  (XANAX ) 0.25 mg tablet         The original prescription was discontinued on 08/02/2023 by Tollie Gwendlyn PARAS, DO. Renewing this prescription may not be appropriate.    Sig: Take one tablet by mouth at bedtime as needed.    Disp: 20 tablet    Refills: 0    Start: 01/14/2024    Class: Normal    PDMP Review May Be Needed    For: Anxiety    Last ordered: 2 years ago (09/20/2021) by Denise R Zwahlen, MD       To be filled at: [id number] [place], [place] - [address]

## 2024-02-09 ENCOUNTER — Encounter: Admit: 2024-02-09 | Discharge: 2024-02-09 | Payer: MEDICARE

## 2024-02-29 ENCOUNTER — Encounter: Admit: 2024-02-29 | Discharge: 2024-02-29 | Payer: MEDICARE

## 2024-03-04 ENCOUNTER — Encounter: Admit: 2024-03-04 | Discharge: 2024-03-04 | Payer: MEDICARE

## 2024-03-05 ENCOUNTER — Ambulatory Visit: Admit: 2024-03-05 | Discharge: 2024-03-05 | Payer: MEDICARE

## 2024-03-05 ENCOUNTER — Encounter: Admit: 2024-03-05 | Discharge: 2024-03-05 | Payer: MEDICARE

## 2024-03-10 ENCOUNTER — Encounter: Admit: 2024-03-10 | Discharge: 2024-03-10 | Payer: MEDICARE
